# Patient Record
Sex: Female | Born: 1985 | Race: White | Hispanic: No | Marital: Married | Smoking: Never smoker
Health system: Southern US, Community
[De-identification: ages and names within clinical notes are randomized; demographics above are authoritative.]

## PROBLEM LIST (undated history)

## (undated) DIAGNOSIS — R87810 Cervical high risk human papillomavirus (HPV) DNA test positive: Secondary | ICD-10-CM

## (undated) DIAGNOSIS — R85612 Low grade squamous intraepithelial lesion on cytologic smear of anus (LGSIL): Secondary | ICD-10-CM

## (undated) DIAGNOSIS — A630 Anogenital (venereal) warts: Secondary | ICD-10-CM

## (undated) DIAGNOSIS — Z803 Family history of malignant neoplasm of breast: Secondary | ICD-10-CM

## (undated) DIAGNOSIS — O24419 Gestational diabetes mellitus in pregnancy, unspecified control: Secondary | ICD-10-CM

## (undated) DIAGNOSIS — I341 Nonrheumatic mitral (valve) prolapse: Secondary | ICD-10-CM

## (undated) DIAGNOSIS — Z6711 Type A blood, Rh negative: Secondary | ICD-10-CM

## (undated) DIAGNOSIS — R87610 Atypical squamous cells of undetermined significance on cytologic smear of cervix (ASC-US): Secondary | ICD-10-CM

## (undated) DIAGNOSIS — R17 Unspecified jaundice: Secondary | ICD-10-CM

## (undated) DIAGNOSIS — Z13220 Encounter for screening for lipoid disorders: Secondary | ICD-10-CM

## (undated) DIAGNOSIS — G43709 Chronic migraine without aura, not intractable, without status migrainosus: Secondary | ICD-10-CM

## (undated) DIAGNOSIS — D696 Thrombocytopenia, unspecified: Secondary | ICD-10-CM

## (undated) DIAGNOSIS — R040 Epistaxis: Secondary | ICD-10-CM

## (undated) HISTORY — PX: CYST EXCISION: SHX5701

## (undated) HISTORY — DX: Cervical high risk human papillomavirus (HPV) DNA test positive: R87.810

## (undated) HISTORY — DX: Low grade squamous intraepithelial lesion on cytologic smear of anus (LGSIL): R85.612

## (undated) HISTORY — DX: Type A blood, Rh negative: Z67.11

## (undated) HISTORY — PX: WISDOM TOOTH EXTRACTION: SHX21

## (undated) HISTORY — DX: Family history of malignant neoplasm of breast: Z80.3

## (undated) HISTORY — DX: Anogenital (venereal) warts: A63.0

## (undated) HISTORY — DX: Atypical squamous cells of undetermined significance on cytologic smear of cervix (ASC-US): R87.610

## (undated) HISTORY — DX: Nonrheumatic mitral (valve) prolapse: I34.1

---

## 2008-03-12 HISTORY — PX: COLPOSCOPY: SHX161

## 2008-10-05 DIAGNOSIS — R87610 Atypical squamous cells of undetermined significance on cytologic smear of cervix (ASC-US): Secondary | ICD-10-CM

## 2008-10-05 HISTORY — DX: Atypical squamous cells of undetermined significance on cytologic smear of cervix (ASC-US): R87.610

## 2009-11-08 ENCOUNTER — Ambulatory Visit: Payer: Self-pay | Admitting: Internal Medicine

## 2009-11-18 ENCOUNTER — Ambulatory Visit: Payer: Self-pay | Admitting: Internal Medicine

## 2009-11-19 ENCOUNTER — Ambulatory Visit: Payer: Self-pay | Admitting: Internal Medicine

## 2009-12-12 ENCOUNTER — Ambulatory Visit: Payer: Self-pay | Admitting: Internal Medicine

## 2010-05-22 ENCOUNTER — Ambulatory Visit: Payer: Self-pay | Admitting: Internal Medicine

## 2010-06-13 ENCOUNTER — Ambulatory Visit: Payer: Self-pay | Admitting: Internal Medicine

## 2011-05-06 DIAGNOSIS — R85612 Low grade squamous intraepithelial lesion on cytologic smear of anus (LGSIL): Secondary | ICD-10-CM

## 2011-05-06 HISTORY — DX: Low grade squamous intraepithelial lesion on cytologic smear of anus (LGSIL): R85.612

## 2011-05-08 DIAGNOSIS — R87810 Cervical high risk human papillomavirus (HPV) DNA test positive: Secondary | ICD-10-CM

## 2011-05-08 HISTORY — DX: Cervical high risk human papillomavirus (HPV) DNA test positive: R87.810

## 2011-06-02 ENCOUNTER — Ambulatory Visit: Payer: Self-pay | Admitting: Oncology

## 2011-06-02 LAB — CBC CANCER CENTER
Basophil #: 0 x10 3/mm (ref 0.0–0.1)
Basophil %: 0.7 %
Eosinophil #: 0.1 x10 3/mm (ref 0.0–0.7)
Eosinophil %: 2.3 %
HCT: 41.8 % (ref 35.0–47.0)
HGB: 13.9 g/dL (ref 12.0–16.0)
Lymphocyte #: 1.9 x10 3/mm (ref 1.0–3.6)
Lymphocyte %: 34.4 %
MCHC: 33.3 g/dL (ref 32.0–36.0)
MCV: 91 fL (ref 80–100)
Monocyte #: 0.3 x10 3/mm (ref 0.2–0.9)
Neutrophil #: 3.2 x10 3/mm (ref 1.4–6.5)
Neutrophil %: 56.8 %
RBC: 4.6 10*6/uL (ref 3.80–5.20)

## 2011-06-13 ENCOUNTER — Ambulatory Visit: Payer: Self-pay | Admitting: Oncology

## 2012-04-14 ENCOUNTER — Ambulatory Visit: Payer: Self-pay

## 2012-06-30 ENCOUNTER — Ambulatory Visit: Payer: Self-pay | Admitting: Family Medicine

## 2013-11-27 IMAGING — US US SOFT TISSUE EXCLUDE HEAD/NECK
1 series · 14 of 24 positions shown · non-contrast
Comparison: none

REASON FOR EXAM: rt axilla lump
COMMENTS:

[Series 1: us soft tissue exclude head/neck · 0.08mm/px · 14 of 24 slices shown]
[im 1/24]
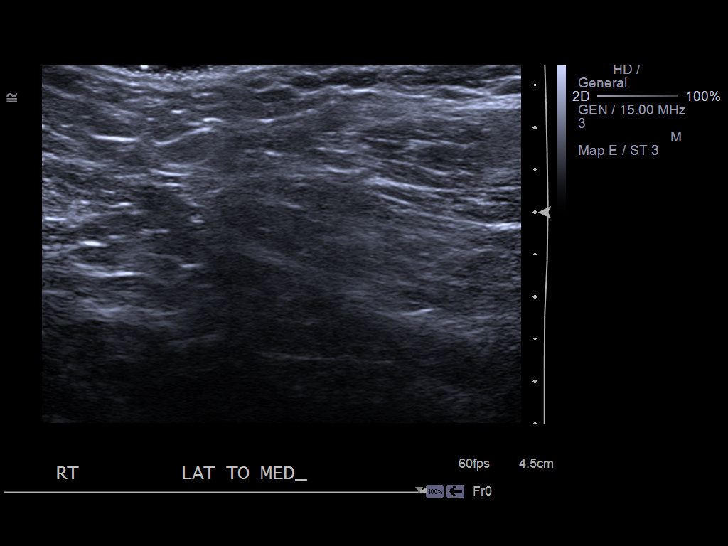
[im 3/24]
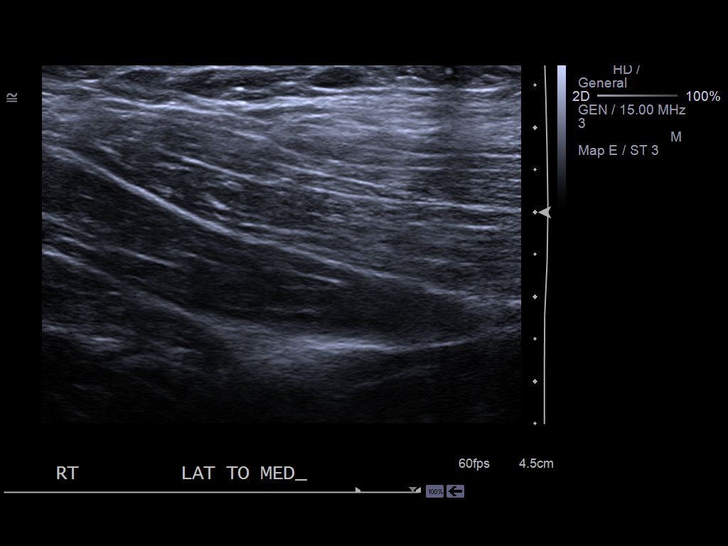
[im 5/24]
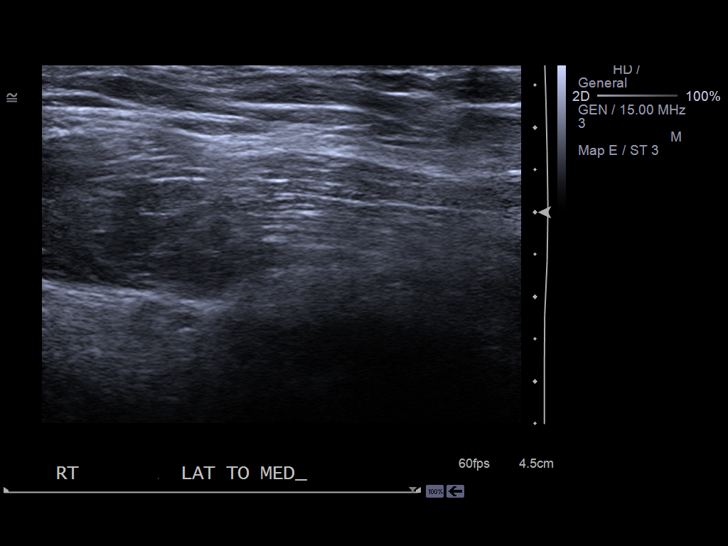
[im 7/24]
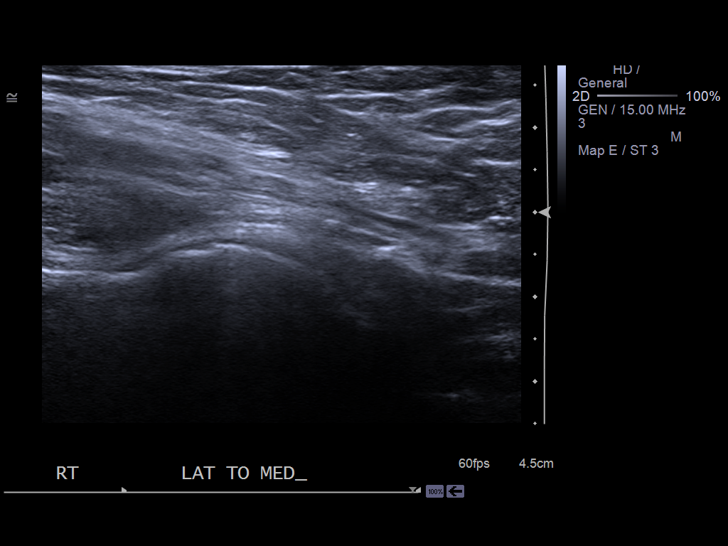
[im 8/24]
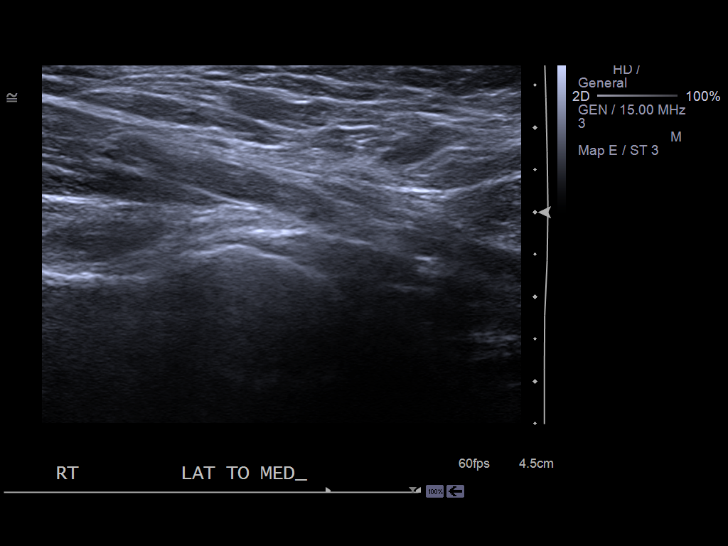
[im 10/24]
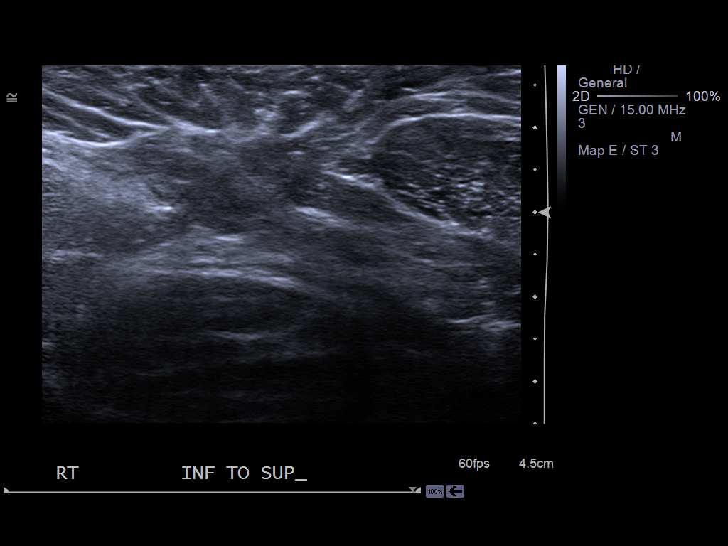
[im 12/24]
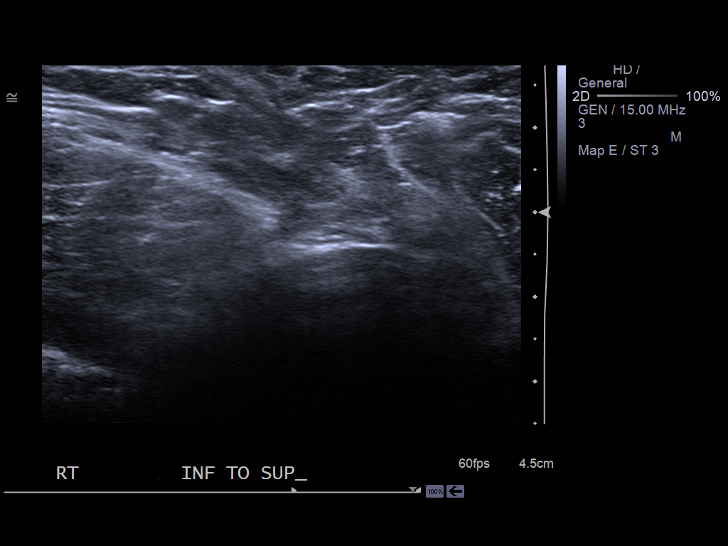
[im 13/24]
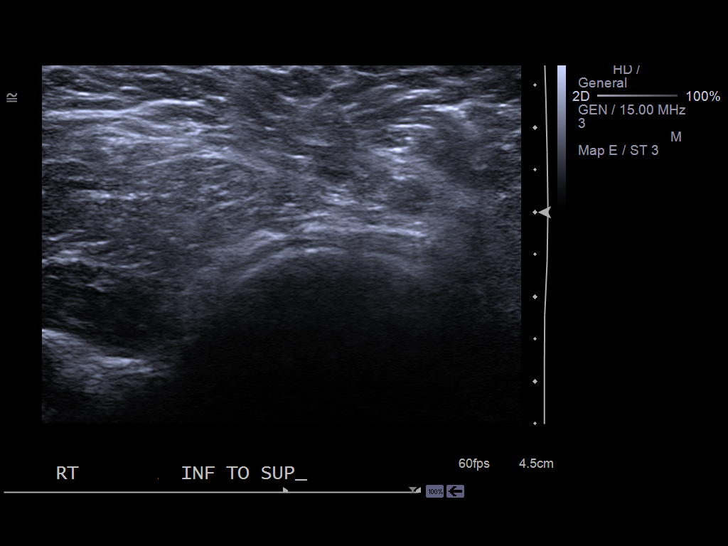
[im 15/24]
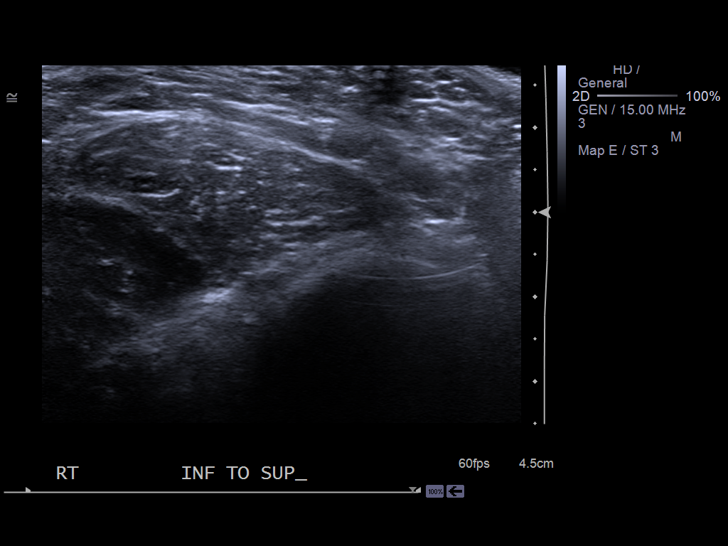
[im 17/24]
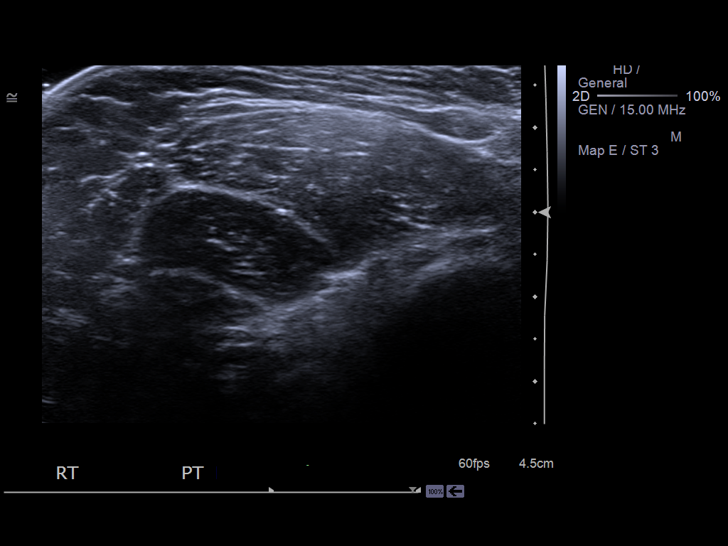
[im 19/24]
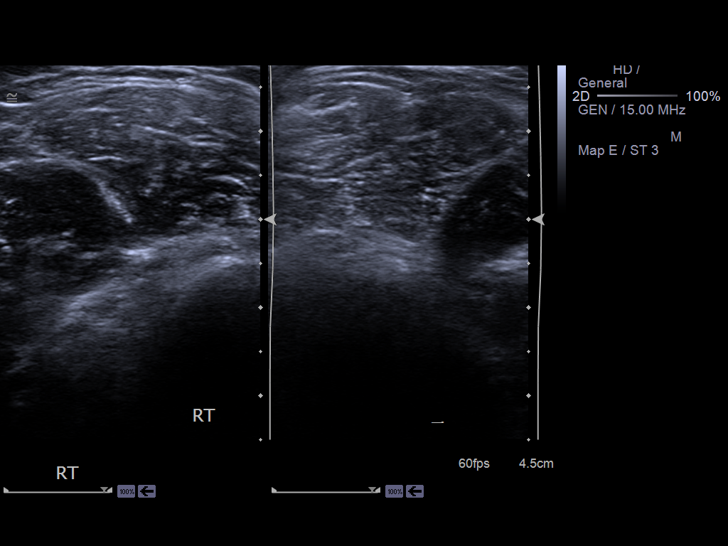
[im 20/24]
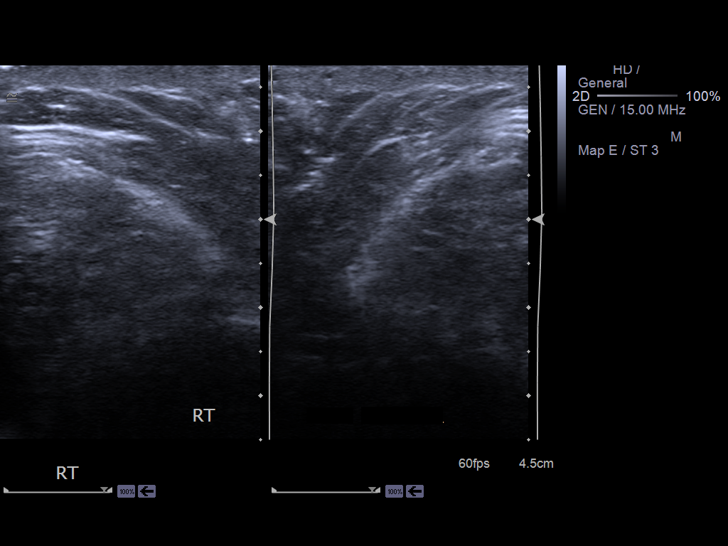
[im 22/24]
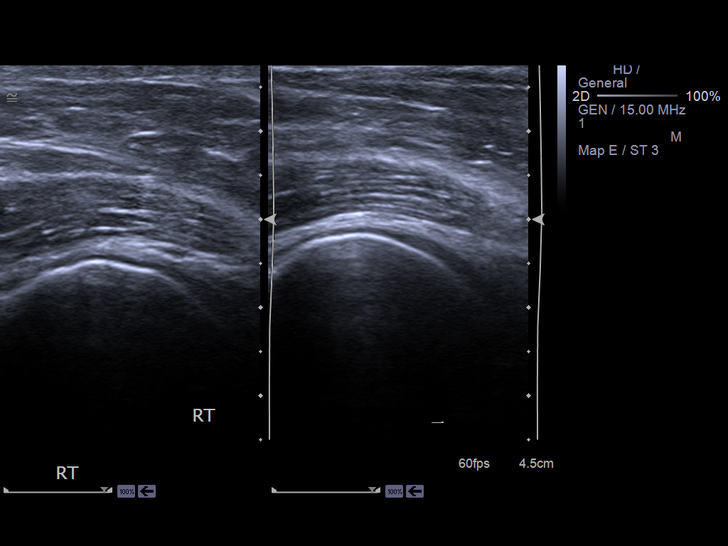
[im 24/24]
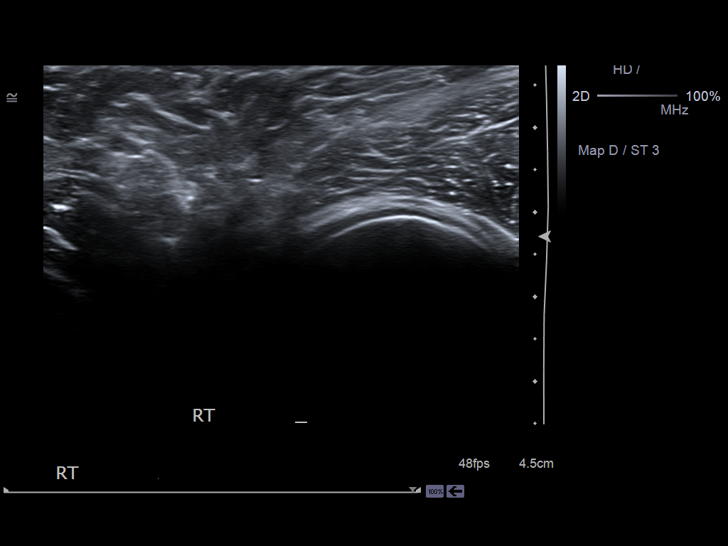

[14 of 24 positions shown; findings below may reference images not displayed]

PROCEDURE:     REEDY - REEDY SOFT TISSUE NOT HEAD/NECK  - April 14, 2012 [DATE]

RESULT:     The patient reports an area of nodularity in the right axilla.
Grayscale and Doppler techniques were employed to evaluate the right axilla.

The echotexture of the imaged parenchyma is normal. There is no cystic or
solid mass. No abnormal vascularity is demonstrated. Comparison views of the
left axillary region reveals no suspicious findings.
IMPRESSION: There is no demonstrable abnormality in the right axilla at
ultrasound. If the patient's symptoms persist, CT scanning of the chest with
attention to the abdomen may be useful.

[REDACTED]

## 2013-12-03 ENCOUNTER — Inpatient Hospital Stay: Payer: Self-pay | Admitting: Obstetrics and Gynecology

## 2013-12-04 LAB — CBC WITH DIFFERENTIAL/PLATELET
BASOS PCT: 0.3 %
Basophil #: 0 10*3/uL (ref 0.0–0.1)
Eosinophil #: 0 10*3/uL (ref 0.0–0.7)
Eosinophil %: 0 %
HCT: 42 % (ref 35.0–47.0)
HGB: 14 g/dL (ref 12.0–16.0)
LYMPHS ABS: 1.6 10*3/uL (ref 1.0–3.6)
LYMPHS PCT: 13.7 %
MCH: 30.8 pg (ref 26.0–34.0)
MCHC: 33.4 g/dL (ref 32.0–36.0)
MCV: 92 fL (ref 80–100)
MONOS PCT: 5.2 %
Monocyte #: 0.6 x10 3/mm (ref 0.2–0.9)
NEUTROS PCT: 80.8 %
Neutrophil #: 9.6 10*3/uL — ABNORMAL HIGH (ref 1.4–6.5)
PLATELETS: 115 10*3/uL — AB (ref 150–440)
RBC: 4.55 10*6/uL (ref 3.80–5.20)
RDW: 13.6 % (ref 11.5–14.5)
WBC: 11.8 10*3/uL — ABNORMAL HIGH (ref 3.6–11.0)

## 2013-12-05 LAB — HEMATOCRIT: HCT: 35.4 % (ref 35.0–47.0)

## 2014-02-12 IMAGING — CR DG CHEST 2V
1 series · 2 of 2 positions shown · non-contrast
Comparison: none

REASON FOR EXAM: chest pain , hx of mvp
COMMENTS:

PROCEDURE:     PULLUM - PULLUM CHEST PA (OR AP) AND LAT  - June 30, 2012  [DATE]
RESULT:     The lungs are clear. The heart and pulmonary vessels are normal.
The bony and mediastinal structures are unremarkable. There is no effusion.
There is no pneumothorax or evidence of congestive failure.

[Series 1: pa · 0.17mm/px · 2 of 2 slices shown]
[im 1/2]
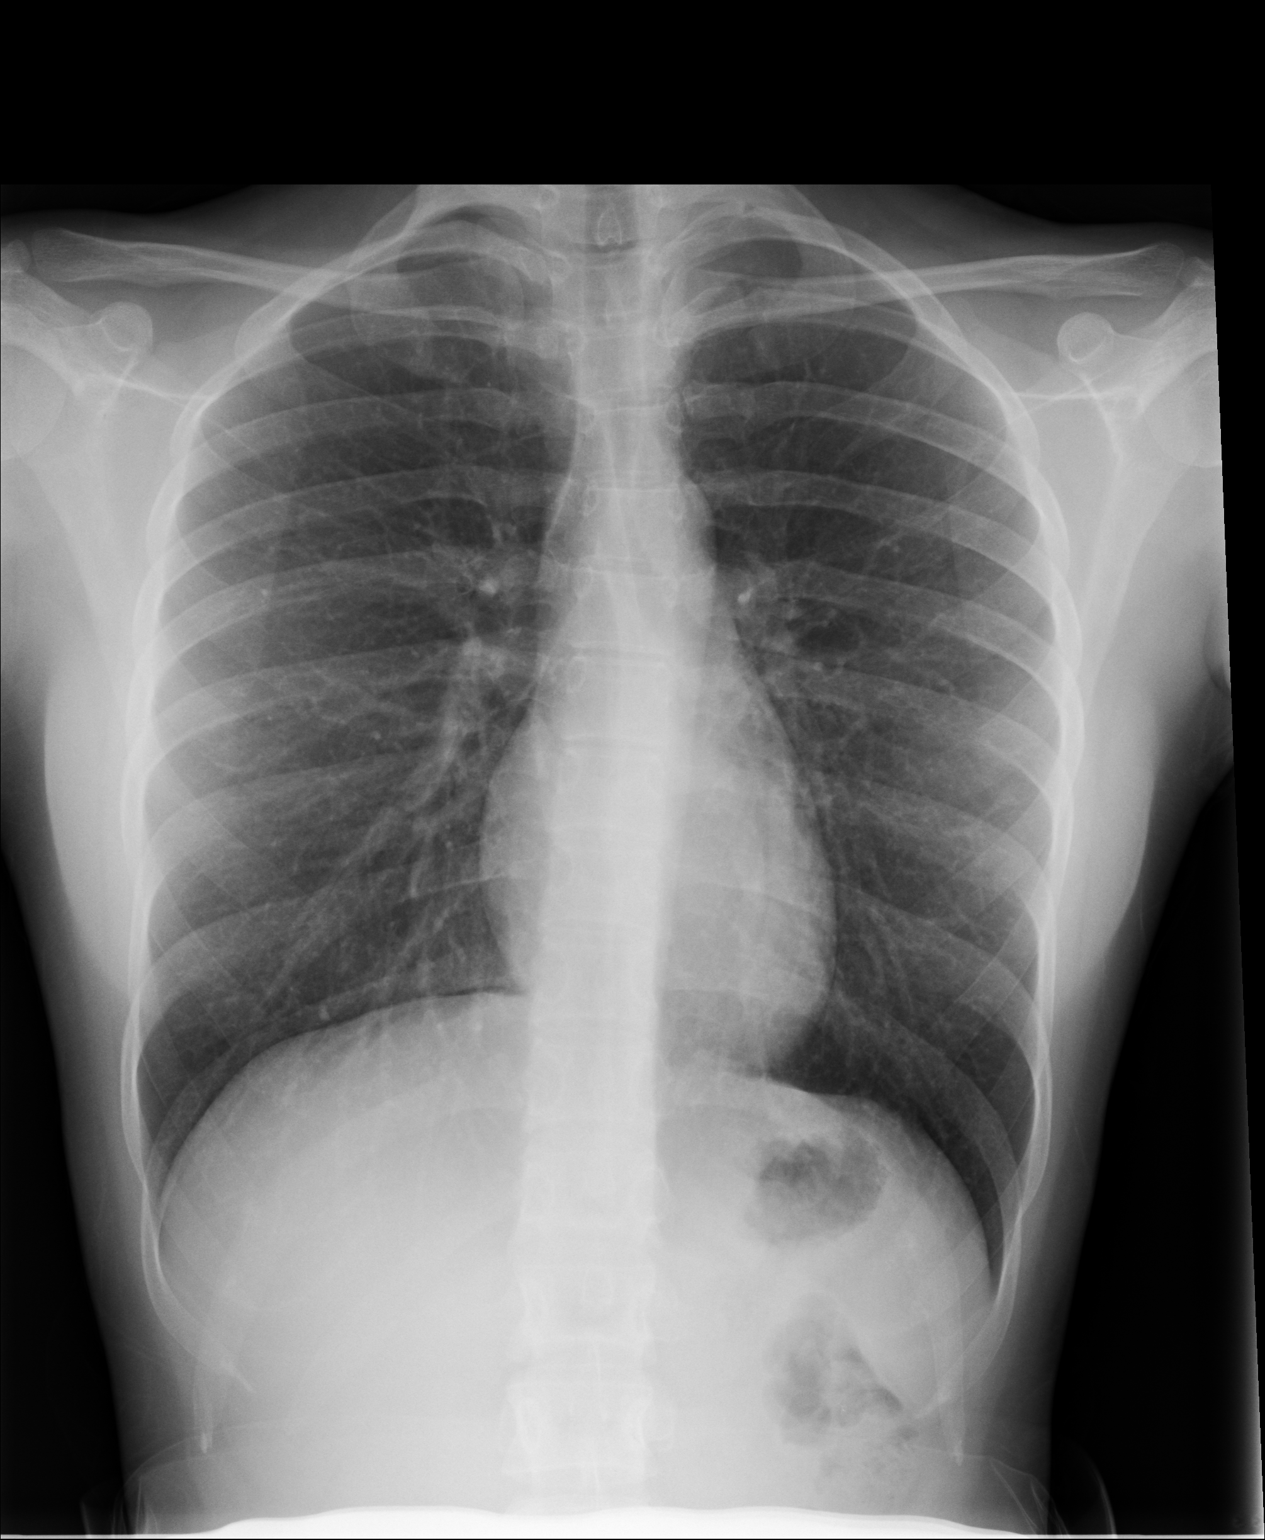
[im 2/2]
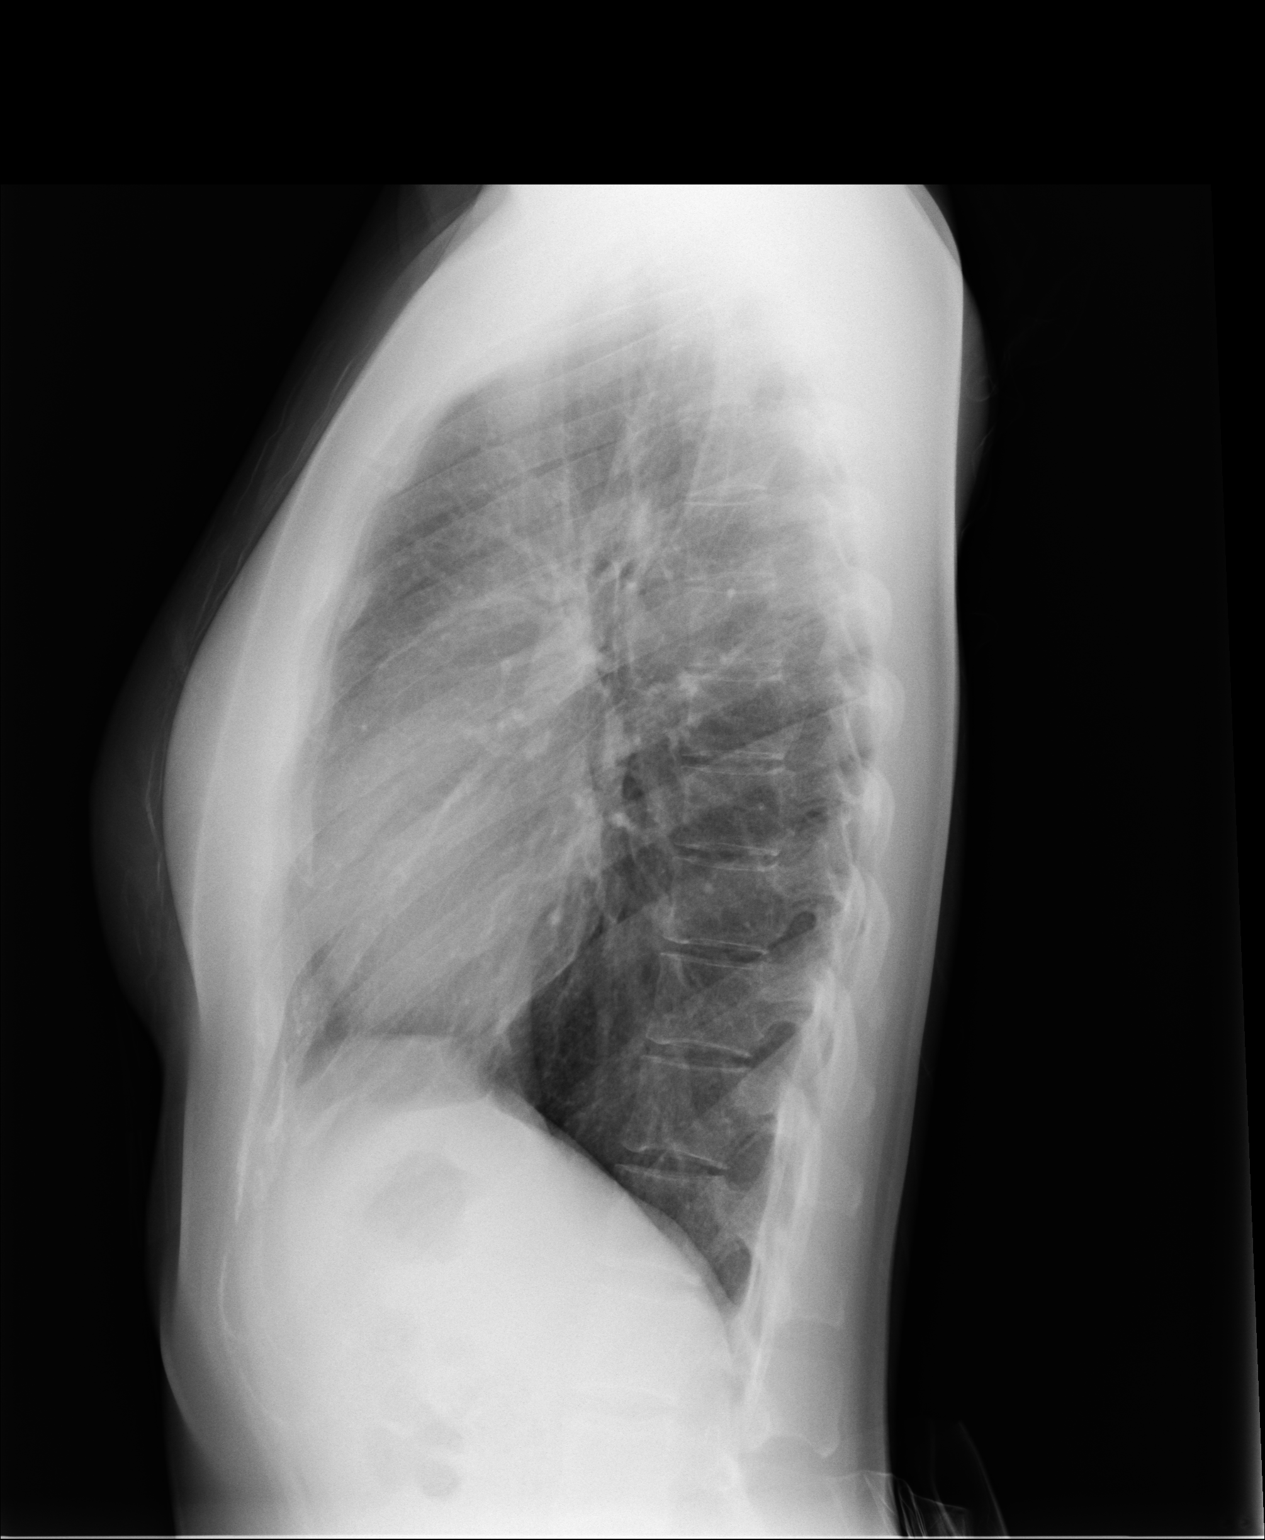

[2 of 2 positions shown; findings below may reference images not displayed]

IMPRESSION: No acute cardiopulmonary disease.

[REDACTED]

## 2014-05-22 NOTE — H&P (Signed)
L&D Evaluation:  History:  HPI 29 year old g1 P0 with EDC=11//21/2015 8wk3 day ultrasound presents  at 40 1/7 weeks with c/o contractions since 0600 this AM. There has been little change in her cervix over the last 4 hours and she remains 3 cm despite painful regular ctxs. Denies vaginal bleeding or LOF. Baby active. PNC at [facility] remarkable for mild thrombocytopenia (134 on 4/1) and being RH negative. She received Rhogam 9/4 and her TDAP 9/18. She failed her 1 hr GTT and passed her 3 hr GTT.  LABS: A neg, VI, RI, GBS negative. Desires to bottle feed.   Presents with contractions   Patient's Medical History No Chronic Illness  H/O abnormal pap smears   Patient's Surgical History Wisdom teeth extraction and colposcopy   Medications Pre Natal Vitamins   Allergies NKDA   Social History none   Family History Non-Contributory   ROS:  ROS see HPI   Exam:  Vital Signs stable   Urine Protein not completed   General breathing with contractions   Mental Status clear   Chest clear   Heart normal sinus rhythm, no murmur/gallop/rubs   Abdomen gravid, tender with contractions   Estimated Fetal Weight 8#   Fetal Position cephalic   Edema no edema   Pelvic no external lesions, 3/80-90%/-1   Mebranes Intact   FHT normal rate with no decels, 130s with accels to 160s   Ucx q4 min   Skin dry   Impression:  Impression IUP at 40 1/7 weeks with early vs prodromal labor   Plan:  Plan EFM/NST, monitor contractions and for cervical change, Morphine 8 mgm and Phenergan 12.5 mgm IM for therapeutic rest. Has eaten and been walking and on the birthing ball.   Electronic Signatures: Karene Fry (CNM)  (Signed [phone] 22:07)  Authored: L&D Evaluation   Last Updated: 22-Nov-15 22:07 by Karene Fry (CNM)

## 2014-09-10 ENCOUNTER — Telehealth: Payer: Self-pay | Admitting: Family Medicine

## 2014-09-10 ENCOUNTER — Encounter: Payer: Self-pay | Admitting: Family Medicine

## 2014-09-10 ENCOUNTER — Ambulatory Visit (INDEPENDENT_AMBULATORY_CARE_PROVIDER_SITE_OTHER): Payer: BC Managed Care – PPO | Admitting: Family Medicine

## 2014-09-10 VITALS — BP 122/87 | HR 102 | Temp 98.7°F | Ht 61.0 in | Wt 102.0 lb

## 2014-09-10 DIAGNOSIS — J029 Acute pharyngitis, unspecified: Secondary | ICD-10-CM

## 2014-09-10 DIAGNOSIS — J020 Streptococcal pharyngitis: Secondary | ICD-10-CM | POA: Diagnosis not present

## 2014-09-10 LAB — MONONUCLEOSIS SCREEN: MONO SCREEN: NEGATIVE

## 2014-09-10 MED ORDER — AMOXICILLIN-POT CLAVULANATE 875-125 MG PO TABS: 1.0000 | ORAL_TABLET | Freq: Two times a day (BID) | ORAL | Status: DC

## 2014-09-10 NOTE — Addendum Note (Signed)
Addended by: Valerie Roys on: 09/10/2014 02:43 PM   Modules accepted: Orders, SmartSet

## 2014-09-10 NOTE — Patient Instructions (Signed)
Infectious Mononucleosis Infectious mononucleosis (mono) is a common germ (viral) infection in children, teenagers, and young adults.  CAUSES  Mono is an infection caused by the Randell Patient virus. The virus is spread by close personal contact with someone who has the infection. It can be passed by contact with your saliva through things such as kissing or sharing drinking glasses. Sometimes, the infection can be spread from someone who does not appear sick but still spreads the virus (asymptomatic carrier state).  SYMPTOMS  The most common symptoms of Mono are:  Sore throat.  Headache.  Fatigue.  Muscle aches.  Swollen glands.  Fever.  Poor appetite.  Enlarged liver or spleen. The less common symptoms can include:  Rash.  Feeling sick to your stomach (nauseous).  Abdominal pain. DIAGNOSIS  Mono is diagnosed by a blood test.  TREATMENT  Treatment of mono is usually at home. There is no medicine that cures this virus. Sometimes hospital treatment is needed in severe cases. Steroid medicine sometimes is needed if the swelling in the throat causes breathing or swallowing problems.  HOME CARE INSTRUCTIONS   Drink enough fluids to keep your urine clear or pale yellow.  Eat soft foods. Cool foods like popsicles or ice cream can soothe a sore throat.  Only take over-the-counter or prescription medicines for pain, discomfort, or fever as directed by your caregiver. Children under 37 years of age should not take aspirin.  Gargle salt water. This may help relieve your sore throat. Put 1 teaspoon (tsp) of salt in 1 cup of warm water. Sucking on hard candy may also help.  Rest as needed.  Start regular activities gradually after the fever is gone. Be sure to rest when tired.  Avoid strenuous exercise or contact sports until your caregiver says it is okay. The liver and spleen could be seriously injured.  Avoid sharing drinking glasses or kissing until your caregiver tells you  that you are no longer contagious. SEEK MEDICAL CARE IF:   Your fever is not gone after 7 days.  Your activity level is not back to normal after 2 weeks.  You have yellow coloring to eyes and skin (jaundice). SEEK IMMEDIATE MEDICAL CARE IF:   You have severe pain in the abdomen or shoulder.  You have trouble swallowing or drooling.  You have trouble breathing.  You develop a stiff neck.  You develop a severe headache.  You cannot stop throwing up (vomiting).  You have convulsions.  You are confused.  You have trouble with balance.  You develop signs of body fluid loss (dehydration):  Weakness.  Sunken eyes.  Pale skin.  Dry mouth.  Rapid breathing or pulse. MAKE SURE YOU:   Understand these instructions.  Will watch your condition.  Will get help right away if you are not doing well or get worse. Document Released: 12/27/1999 Document Revised: 03/23/2011 Document Reviewed: 10/25/2007 [facility] Patient Information 2015 [place], [place]. This information is not intended to replace advice given to you by your health care provider. Make sure you discuss any questions you have with your health care provider. Strep Throat Strep throat is an infection of the throat. It is caused by a germ. Strep throat spreads from person to person by coughing, sneezing, or close contact. HOME CARE  Rinse your mouth (gargle) with warm salt water (1 teaspoon salt in 1 cup of water). Do this 3 to 4 times per day or as needed for comfort.  Family members with a sore throat or fever  should see a doctor.  Make sure everyone in your house washes their hands well.  Do not share food, drinking cups, or personal items.  Eat soft foods until your sore throat gets better.  Drink enough water and fluids to keep your pee (urine) clear or pale yellow.  Rest.  Stay home from school, daycare, or work until you have taken medicine for 24 hours.  Only take medicine as told by your  doctor.  Take your medicine as told. Finish it even if you start to feel better. GET HELP RIGHT AWAY IF:   You have new problems, such as throwing up (vomiting) or bad headaches.  You have a stiff or painful neck, chest pain, trouble breathing, or trouble swallowing.  You have very bad throat pain, drooling, or changes in your voice.  Your neck puffs up (swells) or gets red and tender.  You have a fever.  You are very tired, your mouth is dry, or you are peeing less than normal.  You cannot wake up completely.  You get a rash, cough, or earache.  You have green, yellow-brown, or bloody spit.  Your pain does not get better with medicine. MAKE SURE YOU:   Understand these instructions.  Will watch your condition.  Will get help right away if you are not doing well or get worse. Document Released: 06/17/2007 Document Revised: 03/23/2011 Document Reviewed: 02/27/2010 [facility] Patient Information 2015 [place], [place]. This information is not intended to replace advice given to you by your health care provider. Make sure you discuss any questions you have with your health care provider.

## 2014-09-10 NOTE — Telephone Encounter (Signed)
Called patient to let her know mono was negative. Will treat for strep. Rx sent to her pharmacy.

## 2014-09-10 NOTE — Progress Notes (Signed)
BP 122/87 mmHg  Pulse 102  Temp(Src) 98.7 F (37.1 C)  Ht 5\' 1"  (1.549 m)  Wt 102 lb (46.267 kg)  BMI 19.28 kg/m2  SpO2 100%  LMP 09/03/2014   Subjective:    Patient ID: Monica Shaffer, female    DOB: 11-28-1985, 29 y.o.   MRN: [id number]  HPI: Monica Shaffer is a 29 y.o. female  Chief Complaint  Patient presents with  . Sore Throat    X 3 days, fatigued, some body aches, headache, fever unknown   Worst symptom: Sore throat Fever: no Cough: no Shortness of breath: no Wheezing: no Chest pain: yes, with cough Chest tightness: no Chest congestion: no Nasal congestion: no Runny nose: no Post nasal drip: no Sneezing: no Sore throat: yes Swollen glands: no Sinus pressure: no Headache: yes Face pain: no Toothache: no Ear pain: no  Ear pressure: no  Eyes red/itching:no Eye drainage/crusting: no  Vomiting: no Rash: no Fatigue: yes Sick contacts: no Strep contacts: no  Context: better Recurrent sinusitis: no Relief with OTC cold/cough medications: no  Treatments attempted: none   Relevant past medical, surgical, family and social history reviewed and updated as indicated. Interim medical history since our last visit reviewed. Allergies and medications reviewed and updated.  Review of Systems  Constitutional: Negative.   Respiratory: Negative.     Per HPI unless specifically indicated above     Objective:    BP 122/87 mmHg  Pulse 102  Temp(Src) 98.7 F (37.1 C)  Ht 5\' 1"  (1.549 m)  Wt 102 lb (46.267 kg)  BMI 19.28 kg/m2  SpO2 100%  LMP 09/03/2014  Wt Readings from Last 3 Encounters:  09/10/14 102 lb (46.267 kg)    Physical Exam  Constitutional: She is oriented to person, place, and time. She appears well-developed and well-nourished. No distress.  HENT:  Head: Normocephalic and atraumatic.  Right Ear: Hearing, tympanic membrane, external ear and ear canal normal.  Left Ear: Hearing, tympanic membrane, external ear and ear canal normal.  Nose:  Nose normal.  Mouth/Throat: Uvula is midline and mucous membranes are normal. Oropharyngeal exudate, posterior oropharyngeal edema and posterior oropharyngeal erythema present. No tonsillar abscesses.    Eyes: Conjunctivae and lids are normal. Right eye exhibits no discharge. Left eye exhibits no discharge. No scleral icterus.  Cardiovascular: Normal rate, regular rhythm, normal heart sounds and intact distal pulses.  Exam reveals no gallop and no friction rub.   No murmur heard. Pulmonary/Chest: Effort normal and breath sounds normal. No respiratory distress. She has no wheezes. She has no rales. She exhibits no tenderness.  Musculoskeletal: Normal range of motion.  Neurological: She is alert and oriented to person, place, and time.  Skin: Skin is warm, dry and intact. No rash noted. No erythema. No pallor.  Psychiatric: She has a normal mood and affect. Her speech is normal and behavior is normal. Judgment and thought content normal. Cognition and memory are normal.  Nursing note and vitals reviewed.   Results for orders placed or performed in visit on 12/03/13  CBC with Differential/Platelet  Result Value Ref Range   WBC 11.8 (H) 3.6-11.0 x10 3/mm 3   RBC 4.55 3.80-5.20 X10 6/mm 3   HGB 14.0 12.0-16.0 g/dL   HCT 42.0 35.0-47.0 %   MCV 92 80-100 fL   MCH 30.8 26.0-34.0 pg   MCHC 33.4 32.0-36.0 g/dL   RDW 13.6 11.5-14.5 %   Platelet 115 (L) 150-440 x10 3/mm 3   Neutrophil % 80.8 %  Lymphocyte % 13.7 %   Monocyte % 5.2 %   Eosinophil % 0.0 %   Basophil % 0.3 %   Neutrophil # 9.6 (H) 1.4-6.5 x10 3/mm 3   Lymphocyte # 1.6 1.0-3.6 x10 3/mm 3   Monocyte # 0.6 0.2-0.9 x10 3/mm    Eosinophil # 0.0 0.0-0.7 x10 3/mm 3   Basophil # 0.0 0.0-0.1 x10 3/mm 3  Hematocrit  Result Value Ref Range   HCT 35.4 35.0-47.0 %      Assessment & Plan:   Problem List Items Addressed This Visit    None    Visit Diagnoses    Sore throat    -  Primary    Rapid strep negative. Awaiting monospot.  If monospot + symptomatic treatment. If monospot neg will treat for strep emprically while waiting on culture.     Relevant Orders    Strep Gp A Ag, IA W/Reflex    Mononucleosis screen (STAT)        Follow up plan: No Follow-up on file.

## 2014-09-12 LAB — STREP GP A AG, IA W/REFLEX

## 2015-06-12 ENCOUNTER — Ambulatory Visit (INDEPENDENT_AMBULATORY_CARE_PROVIDER_SITE_OTHER): Payer: BC Managed Care – PPO | Admitting: Unknown Physician Specialty

## 2015-06-12 ENCOUNTER — Encounter: Payer: Self-pay | Admitting: Unknown Physician Specialty

## 2015-06-12 VITALS — BP 119/76 | HR 82 | Temp 97.2°F | Ht 60.5 in | Wt 104.6 lb

## 2015-06-12 DIAGNOSIS — L409 Psoriasis, unspecified: Secondary | ICD-10-CM | POA: Diagnosis not present

## 2015-06-12 DIAGNOSIS — D2362 Other benign neoplasm of skin of left upper limb, including shoulder: Secondary | ICD-10-CM

## 2015-06-12 DIAGNOSIS — D367 Benign neoplasm of other specified sites: Secondary | ICD-10-CM

## 2015-06-12 MED ORDER — TRIAMCINOLONE ACETONIDE 0.1 % EX CREA: 1.0000 "application " | TOPICAL_CREAM | Freq: Two times a day (BID) | CUTANEOUS | Status: DC

## 2015-06-12 NOTE — Progress Notes (Signed)
   BP 119/76 mmHg  Pulse 82  Temp(Src) 97.2 F (36.2 C)  Ht 5' 0.5" (1.537 m)  Wt 104 lb 9.6 oz (47.446 kg)  BMI 20.08 kg/m2  SpO2 100%  LMP 05/23/2015 (Approximate)   Subjective:    Patient ID: Monica Shaffer, female    DOB: [birth year], 30 y.o.   MRN: [id number]  HPI: Monica Shaffer is a 30 y.o. female  Chief Complaint  Patient presents with  . Mass    pt states she has a lump on her left arm that came up a few months ago. States it is not painful.    Pt states she has a lump on arm that she states has gotten bigger and darker.  It is painless.    Patch of skin left foot on flexor surface Relevant past medical, surgical, family and social history reviewed and updated as indicated. Interim medical history since our last visit reviewed. Allergies and medications reviewed and updated.  Review of Systems  Per HPI unless specifically indicated above     Objective:    BP 119/76 mmHg  Pulse 82  Temp(Src) 97.2 F (36.2 C)  Ht 5' 0.5" (1.537 m)  Wt 104 lb 9.6 oz (47.446 kg)  BMI 20.08 kg/m2  SpO2 100%  LMP 05/23/2015 (Approximate)  Wt Readings from Last 3 Encounters:  06/12/15 104 lb 9.6 oz (47.446 kg)  09/10/14 102 lb (46.267 kg)    Physical Exam  Constitutional: She is oriented to person, place, and time. She appears well-developed and well-nourished. No distress.  HENT:  Head: Normocephalic and atraumatic.  Eyes: Conjunctivae and lids are normal. Right eye exhibits no discharge. Left eye exhibits no discharge. No scleral icterus.  Cardiovascular: Normal rate.   Pulmonary/Chest: Effort normal.  Abdominal: Normal appearance. There is no splenomegaly or hepatomegaly.  Musculoskeletal: Normal range of motion.  Neurological: She is alert and oriented to person, place, and time.  Skin: Skin is intact. No rash noted. No pallor.  Painless lump 2 cms on left arm.  I&D unsuccessful.  Refer to Dermatology  Patch of dry skin left ankle  Psychiatric: She has a normal mood and  affect. Her behavior is normal. Judgment and thought content normal.    Results for orders placed or performed in visit on 09/10/14  Strep Gp A Ag, IA W/Reflex  Result Value Ref Range   Strep A Culture Comment (A)   Mononucleosis screen (STAT)  Result Value Ref Range   Mono Screen Negative Negative      Assessment & Plan:   Problem List Items Addressed This Visit      Unprioritized   Psoriasis - Primary   Relevant Medications   triamcinolone cream (KENALOG) 0.1 %    Other Visit Diagnoses    Cyst, dermoid, arm, left        Relevant Orders    Ambulatory referral to Dermatology        Follow up plan: Return for with dermatology.

## 2016-02-03 LAB — OB RESULTS CONSOLE ABO/RH: RH Type: NEGATIVE

## 2016-02-03 LAB — OB RESULTS CONSOLE RPR: RPR: NONREACTIVE

## 2016-02-03 LAB — HM PAP SMEAR

## 2016-02-03 LAB — OB RESULTS CONSOLE GC/CHLAMYDIA
CHLAMYDIA, DNA PROBE: NEGATIVE
GC PROBE AMP, GENITAL: NEGATIVE

## 2016-02-03 LAB — OB RESULTS CONSOLE HIV ANTIBODY (ROUTINE TESTING): HIV: NONREACTIVE

## 2016-02-03 LAB — OB RESULTS CONSOLE PLATELET COUNT: Platelets: 162 10*3/uL

## 2016-02-03 LAB — OB RESULTS CONSOLE ANTIBODY SCREEN: ANTIBODY SCREEN: NEGATIVE

## 2016-02-03 LAB — OB RESULTS CONSOLE HEPATITIS B SURFACE ANTIGEN: HEP B S AG: NEGATIVE

## 2016-02-03 LAB — OB RESULTS CONSOLE RUBELLA ANTIBODY, IGM: Rubella: IMMUNE

## 2016-02-03 LAB — OB RESULTS CONSOLE VARICELLA ZOSTER ANTIBODY, IGG: Varicella: IMMUNE

## 2016-02-03 LAB — OB RESULTS CONSOLE HGB/HCT, BLOOD
HEMATOCRIT: 42 %
HEMOGLOBIN: 14.2 g/dL

## 2016-04-02 ENCOUNTER — Ambulatory Visit (INDEPENDENT_AMBULATORY_CARE_PROVIDER_SITE_OTHER): Payer: BC Managed Care – PPO | Admitting: Advanced Practice Midwife

## 2016-04-02 ENCOUNTER — Encounter: Payer: Self-pay | Admitting: Advanced Practice Midwife

## 2016-04-02 VITALS — BP 126/66 | Wt 115.0 lb

## 2016-04-02 DIAGNOSIS — Z3A15 15 weeks gestation of pregnancy: Secondary | ICD-10-CM

## 2016-04-02 DIAGNOSIS — Z3400 Encounter for supervision of normal first pregnancy, unspecified trimester: Secondary | ICD-10-CM

## 2016-04-02 DIAGNOSIS — Z369 Encounter for antenatal screening, unspecified: Secondary | ICD-10-CM

## 2016-04-02 NOTE — Progress Notes (Signed)
Doing well. Feels like she is getting bigger faster with this pregnancy. Heart tones heard in appropriate location for 16 week pregnancy.

## 2016-04-07 DIAGNOSIS — Z803 Family history of malignant neoplasm of breast: Secondary | ICD-10-CM

## 2016-04-07 DIAGNOSIS — Z6711 Type A blood, Rh negative: Secondary | ICD-10-CM | POA: Insufficient documentation

## 2016-05-01 ENCOUNTER — Encounter: Payer: Self-pay | Admitting: Certified Nurse Midwife

## 2016-05-01 ENCOUNTER — Ambulatory Visit (INDEPENDENT_AMBULATORY_CARE_PROVIDER_SITE_OTHER): Payer: BC Managed Care – PPO | Admitting: Certified Nurse Midwife

## 2016-05-01 ENCOUNTER — Ambulatory Visit (INDEPENDENT_AMBULATORY_CARE_PROVIDER_SITE_OTHER): Payer: BC Managed Care – PPO

## 2016-05-01 VITALS — BP 108/68 | HR 101 | Wt 119.0 lb

## 2016-05-01 DIAGNOSIS — Z3A19 19 weeks gestation of pregnancy: Secondary | ICD-10-CM

## 2016-05-01 DIAGNOSIS — Z3400 Encounter for supervision of normal first pregnancy, unspecified trimester: Secondary | ICD-10-CM

## 2016-05-01 DIAGNOSIS — O09299 Supervision of pregnancy with other poor reproductive or obstetric history, unspecified trimester: Secondary | ICD-10-CM

## 2016-05-01 DIAGNOSIS — Z3402 Encounter for supervision of normal first pregnancy, second trimester: Secondary | ICD-10-CM

## 2016-05-01 DIAGNOSIS — Z369 Encounter for antenatal screening, unspecified: Secondary | ICD-10-CM

## 2016-05-01 DIAGNOSIS — Z3490 Encounter for supervision of normal pregnancy, unspecified, unspecified trimester: Secondary | ICD-10-CM | POA: Insufficient documentation

## 2016-05-01 DIAGNOSIS — O4440 Low lying placenta NOS or without hemorrhage, unspecified trimester: Secondary | ICD-10-CM | POA: Insufficient documentation

## 2016-05-01 NOTE — Progress Notes (Signed)
Pt c/o frequent shortness of breath and tingling in right thigh at night. Anatomy scan today. It's a GIRL!

## 2016-05-01 NOTE — Progress Notes (Signed)
CGA on anatomy scan today 19wk4d, with normal but incomplete anatomy. Need N/L and 4 chamber heart views. Placenta posterior and slightly low lying : 2.08 cm from cervix. FU anatomy scan in 4 weeks Discussed some discomforts of pregnancy: has been having some right thigh paresthesia (? Compression of right lateral cutaneous nerve), episodes of SOB lasting a minute or two, and nasal congestion and bloody nose with blowing nose in AM. DIscussed possible etiology behind these complaints. Discussed what to do for persistent or prolonged SOB. Also discussed using nasal saline or a humidifier when sleeping.

## 2016-05-29 ENCOUNTER — Ambulatory Visit (INDEPENDENT_AMBULATORY_CARE_PROVIDER_SITE_OTHER): Payer: BC Managed Care – PPO

## 2016-05-29 ENCOUNTER — Ambulatory Visit (INDEPENDENT_AMBULATORY_CARE_PROVIDER_SITE_OTHER): Payer: BC Managed Care – PPO | Admitting: Obstetrics and Gynecology

## 2016-05-29 VITALS — BP 110/64 | Wt 126.0 lb

## 2016-05-29 DIAGNOSIS — Z113 Encounter for screening for infections with a predominantly sexual mode of transmission: Secondary | ICD-10-CM

## 2016-05-29 DIAGNOSIS — Z3490 Encounter for supervision of normal pregnancy, unspecified, unspecified trimester: Secondary | ICD-10-CM

## 2016-05-29 DIAGNOSIS — O4440 Low lying placenta NOS or without hemorrhage, unspecified trimester: Secondary | ICD-10-CM | POA: Diagnosis not present

## 2016-05-29 DIAGNOSIS — Z6711 Type A blood, Rh negative: Secondary | ICD-10-CM

## 2016-05-29 DIAGNOSIS — Z3A23 23 weeks gestation of pregnancy: Secondary | ICD-10-CM

## 2016-05-29 DIAGNOSIS — O09299 Supervision of pregnancy with other poor reproductive or obstetric history, unspecified trimester: Secondary | ICD-10-CM

## 2016-05-29 DIAGNOSIS — Z3402 Encounter for supervision of normal first pregnancy, second trimester: Secondary | ICD-10-CM

## 2016-05-29 DIAGNOSIS — Z131 Encounter for screening for diabetes mellitus: Secondary | ICD-10-CM

## 2016-05-29 NOTE — Progress Notes (Signed)
No vb. No lof. Anatomy scan complete now.

## 2016-06-29 ENCOUNTER — Ambulatory Visit (INDEPENDENT_AMBULATORY_CARE_PROVIDER_SITE_OTHER): Payer: BC Managed Care – PPO | Admitting: Obstetrics and Gynecology

## 2016-06-29 ENCOUNTER — Other Ambulatory Visit: Payer: BC Managed Care – PPO

## 2016-06-29 VITALS — BP 118/72 | Wt 132.0 lb

## 2016-06-29 DIAGNOSIS — Z113 Encounter for screening for infections with a predominantly sexual mode of transmission: Secondary | ICD-10-CM

## 2016-06-29 DIAGNOSIS — O09299 Supervision of pregnancy with other poor reproductive or obstetric history, unspecified trimester: Secondary | ICD-10-CM

## 2016-06-29 DIAGNOSIS — Z6711 Type A blood, Rh negative: Secondary | ICD-10-CM

## 2016-06-29 DIAGNOSIS — O09899 Supervision of other high risk pregnancies, unspecified trimester: Secondary | ICD-10-CM

## 2016-06-29 DIAGNOSIS — Z131 Encounter for screening for diabetes mellitus: Secondary | ICD-10-CM

## 2016-06-29 DIAGNOSIS — Z3490 Encounter for supervision of normal pregnancy, unspecified, unspecified trimester: Secondary | ICD-10-CM

## 2016-06-29 DIAGNOSIS — Z3A28 28 weeks gestation of pregnancy: Secondary | ICD-10-CM

## 2016-06-29 DIAGNOSIS — O4440 Low lying placenta NOS or without hemorrhage, unspecified trimester: Secondary | ICD-10-CM

## 2016-06-29 DIAGNOSIS — O26899 Other specified pregnancy related conditions, unspecified trimester: Secondary | ICD-10-CM

## 2016-06-29 DIAGNOSIS — Z6791 Unspecified blood type, Rh negative: Secondary | ICD-10-CM

## 2016-06-29 MED ORDER — RHO D IMMUNE GLOBULIN 1500 UNIT/2ML IJ SOSY
300.0000 ug | PREFILLED_SYRINGE | Freq: Once | INTRAMUSCULAR | Status: AC
  Administered 2016-06-29: 300 ug via INTRAMUSCULAR

## 2016-06-29 NOTE — Addendum Note (Signed)
Addended by: Brien Few on: 06/29/2016 09:58 AM   Modules accepted: Orders

## 2016-06-29 NOTE — Progress Notes (Signed)
28 week labs today. No vb. No lof. Rhogam today.   U/s at 34 weeks for growth Possible social indux.

## 2016-06-30 LAB — 28 WEEKS RH-PANEL
ANTIBODY SCREEN: NEGATIVE
BASOS: 0 %
Basophils Absolute: 0 10*3/uL (ref 0.0–0.2)
EOS (ABSOLUTE): 0 10*3/uL (ref 0.0–0.4)
Eos: 0 %
Gestational Diabetes Screen: 138 mg/dL (ref 65–139)
HIV Screen 4th Generation wRfx: NONREACTIVE
Hematocrit: 33.1 % — ABNORMAL LOW (ref 34.0–46.6)
Hemoglobin: 10.6 g/dL — ABNORMAL LOW (ref 11.1–15.9)
IMMATURE GRANS (ABS): 0 10*3/uL (ref 0.0–0.1)
Immature Granulocytes: 1 %
LYMPHS: 20 %
Lymphocytes Absolute: 1.4 10*3/uL (ref 0.7–3.1)
MCH: 28.4 pg (ref 26.6–33.0)
MCHC: 32 g/dL (ref 31.5–35.7)
MCV: 89 fL (ref 79–97)
MONOCYTES: 4 %
Monocytes Absolute: 0.3 10*3/uL (ref 0.1–0.9)
NEUTROS ABS: 5.2 10*3/uL (ref 1.4–7.0)
NEUTROS PCT: 75 %
Platelets: 172 10*3/uL (ref 150–379)
RBC: 3.73 x10E6/uL — AB (ref 3.77–5.28)
RDW: 13.2 % (ref 12.3–15.4)
RPR Ser Ql: NONREACTIVE
WBC: 6.9 10*3/uL (ref 3.4–10.8)

## 2016-07-14 ENCOUNTER — Ambulatory Visit (INDEPENDENT_AMBULATORY_CARE_PROVIDER_SITE_OTHER): Payer: BC Managed Care – PPO | Admitting: Obstetrics and Gynecology

## 2016-07-14 VITALS — BP 116/70 | Wt 134.0 lb

## 2016-07-14 DIAGNOSIS — Z23 Encounter for immunization: Secondary | ICD-10-CM | POA: Diagnosis not present

## 2016-07-14 DIAGNOSIS — Z6711 Type A blood, Rh negative: Secondary | ICD-10-CM

## 2016-07-14 DIAGNOSIS — O4440 Low lying placenta NOS or without hemorrhage, unspecified trimester: Secondary | ICD-10-CM

## 2016-07-14 DIAGNOSIS — Z3490 Encounter for supervision of normal pregnancy, unspecified, unspecified trimester: Secondary | ICD-10-CM

## 2016-07-14 DIAGNOSIS — Z3A30 30 weeks gestation of pregnancy: Secondary | ICD-10-CM

## 2016-07-14 DIAGNOSIS — O09299 Supervision of pregnancy with other poor reproductive or obstetric history, unspecified trimester: Secondary | ICD-10-CM

## 2016-07-14 NOTE — Progress Notes (Signed)
No vb. No lof. TDAP today.  °

## 2016-07-28 ENCOUNTER — Ambulatory Visit (INDEPENDENT_AMBULATORY_CARE_PROVIDER_SITE_OTHER): Payer: BC Managed Care – PPO | Admitting: Obstetrics and Gynecology

## 2016-07-28 VITALS — BP 118/72 | Wt 136.0 lb

## 2016-07-28 DIAGNOSIS — Z3490 Encounter for supervision of normal pregnancy, unspecified, unspecified trimester: Secondary | ICD-10-CM

## 2016-07-28 DIAGNOSIS — Z3A32 32 weeks gestation of pregnancy: Secondary | ICD-10-CM

## 2016-07-28 DIAGNOSIS — O09299 Supervision of pregnancy with other poor reproductive or obstetric history, unspecified trimester: Secondary | ICD-10-CM

## 2016-07-28 DIAGNOSIS — Z6711 Type A blood, Rh negative: Secondary | ICD-10-CM

## 2016-07-28 NOTE — Progress Notes (Signed)
Prenatal Visit Note Date: 07/28/2016 Clinic: [facility]  Subjective:  Monica Shaffer is a 31 y.o. G2P1001 at [redacted]w[redacted]d being seen today for ongoing prenatal care.  She is currently monitored for the following issues for this low-risk pregnancy and has Psoriasis; Type A blood, Rh negative; Family history of breast cancer; Second pregnancy; H/O shoulder dystocia in prior pregnancy, currently pregnant; and Low-lying placenta on her problem list.  Patient reports no bleeding, no contractions, no cramping and no leaking.    . Vag. Bleeding: None.  Movement: Present. Denies leaking of fluid.   The following portions of the patient's history were reviewed and updated as appropriate: allergies, current medications, past family history, past medical history, past social history, past surgical history and problem list. Problem list updated.  Objective:   Vitals:   07/28/16 1347  BP: 118/72  Weight: 136 lb (61.7 kg)    Fetal Status: Fetal Heart Rate (bpm): 145 Fundal Height: 32 cm Movement: Present     General:  Alert, oriented and cooperative. Patient is in no acute distress.  Skin: Skin is warm and dry. No rash noted.   Cardiovascular: Normal heart rate noted  Respiratory: Normal respiratory effort, no problems with respiration noted  Abdomen: Soft, gravid, appropriate for gestational age. Pain/Pressure: Absent     Pelvic:  Cervical exam deferred        Extremities: Normal range of motion.  Edema: None  Mental Status: Normal mood and affect. Normal behavior. Normal judgment and thought content.   Urinalysis: Urine Protein: Negative Urine Glucose: Negative  Assessment and Plan:  Pregnancy: G2P1001 at [redacted]w[redacted]d  1. Type A blood, Rh negative -s/p rhogam 2. Second pregnancy - US OB Follow Up; Future  3. H/O shoulder dystocia in prior pregnancy, currently pregnant - US OB Follow Up; Future  (u/s for growth assessment). Discussed risk of shoulder dystocia with this pregnancy 4. [redacted] weeks  gestation of pregnancy  Preterm labor symptoms and general obstetric precautions including but not limited to vaginal bleeding, contractions, leaking of fluid and fetal movement were reviewed in detail with the patient. Please refer to After Visit Summary for other counseling recommendations.  Return in about 2 weeks (around 08/11/2016) for schedule u/s for growth and routine prenatal after.   Prentice Docker, MD 07/28/2016 2:23 PM

## 2016-08-06 ENCOUNTER — Telehealth: Payer: Self-pay

## 2016-08-06 NOTE — Telephone Encounter (Signed)
Pt 34wks, 31yr old has had stomach bug, pt and hsb woke up with it this morning.  Should she see PCP or be seen here?  [phone]  Line busy.  Spoke to pt.  Adv sip clear liquids x24hrs, then bland diet x24hrs, the work up to reg diet.  To be seen if doesn't keep liquids down x24hrs.

## 2016-08-12 ENCOUNTER — Ambulatory Visit (INDEPENDENT_AMBULATORY_CARE_PROVIDER_SITE_OTHER): Payer: BC Managed Care – PPO | Admitting: Obstetrics and Gynecology

## 2016-08-12 ENCOUNTER — Ambulatory Visit (INDEPENDENT_AMBULATORY_CARE_PROVIDER_SITE_OTHER): Payer: BC Managed Care – PPO

## 2016-08-12 VITALS — BP 118/74 | Wt 139.0 lb

## 2016-08-12 DIAGNOSIS — Z3490 Encounter for supervision of normal pregnancy, unspecified, unspecified trimester: Secondary | ICD-10-CM | POA: Diagnosis not present

## 2016-08-12 DIAGNOSIS — Z3A34 34 weeks gestation of pregnancy: Secondary | ICD-10-CM

## 2016-08-12 DIAGNOSIS — O09299 Supervision of pregnancy with other poor reproductive or obstetric history, unspecified trimester: Secondary | ICD-10-CM | POA: Diagnosis not present

## 2016-08-12 DIAGNOSIS — Z6711 Type A blood, Rh negative: Secondary | ICD-10-CM

## 2016-08-12 DIAGNOSIS — Z362 Encounter for other antenatal screening follow-up: Secondary | ICD-10-CM

## 2016-08-12 NOTE — Progress Notes (Signed)
Prenatal Visit Note Date: 08/12/2016 Clinic: [facility]  Subjective:  Monica Shaffer is a 31 y.o. G2P1001 at [redacted]w[redacted]d being seen today for ongoing prenatal care.  She is currently monitored for the following issues for this low-risk pregnancy and has Psoriasis; Type A blood, Rh negative; Family history of breast cancer; Second pregnancy; H/O shoulder dystocia in prior pregnancy, currently pregnant; and Low-lying placenta on her problem list.   ----------------------------------------------------------------------------------- Patient reports no bleeding, no contractions and no leaking.    . Vag. Bleeding: None.  Movement: Present. Denies leaking of fluid.  Growth u/s today shows 2,859 grams (6 lb 5oz) 74th %ile, AFI 10cm.  AC > 97.7% ([redacted]w[redacted]d) ----------------------------------------------------------------------------------- The following portions of the patient's history were reviewed and updated as appropriate: allergies, current medications, past family history, past medical history, past social history, past surgical history and problem list. Problem list updated.  Objective:   Vitals:   08/12/16 1450  BP: 118/74  Weight: 139 lb (63 kg)   Total weight gain for pregnancy: 29 lb (13.2 kg)   Fetal Status: Fetal Heart Rate (bpm): 150 Fundal Height: 35 cm Movement: Present     General:  Alert, oriented and cooperative. Patient is in no acute distress.  Skin: Skin is warm and dry. No rash noted.   Cardiovascular: Normal heart rate noted  Respiratory: Normal respiratory effort, no problems with respiration noted  Abdomen: Soft, gravid, appropriate for gestational age. Pain/Pressure: Absent     Pelvic:  Cervical exam deferred        Extremities: Normal range of motion.     Mental Status: Normal mood and affect. Normal behavior. Normal judgment and thought content.   Urinalysis: Urine Protein: Negative Urine Glucose: Negative  Assessment and Plan:  Pregnancy: G2P1001 at [redacted]w[redacted]d  1.  Second pregnancy 2. Type A blood, Rh negative 3. [redacted] weeks gestation of pregnancy 4. H/O shoulder dystocia in prior pregnancy, currently pregnant - Will obtain 1 week worth of finger-stick glucose at home, fasting and 2h pp with 1h gtt of 138.   - discussed risk of shoulder dystocia with h/o shoulder dystocia of G1 with 7 lb infant. Current EFW is 6lb 5 oz at 34 weeks.  - repeat growth u/s at 37 weeks.  Preterm labor symptoms and general obstetric precautions including but not limited to vaginal bleeding, contractions, leaking of fluid and fetal movement were reviewed in detail with the patient. Please refer to After Visit Summary for other counseling recommendations.   Return in about 2 weeks (around 08/26/2016) for Routine Prenatal Appointment.  Prentice Docker, MD 08/12/2016 5:48 PM

## 2016-08-12 NOTE — Patient Instructions (Signed)
Check you blood glucose values: 1) fasting (first thing in the morning before eating or drinking) - target is 60-95 mg/dL 2) 2 hours after each meal (breakfast, lunch, and dinner) - target is < 120 mg/dL  Please let me know what results you have gotten after 1 week.

## 2016-08-14 ENCOUNTER — Encounter: Payer: Self-pay | Admitting: Obstetrics and Gynecology

## 2016-08-24 ENCOUNTER — Telehealth: Payer: Self-pay

## 2016-08-24 NOTE — Telephone Encounter (Signed)
FMLA/DISABILITY form for [employer] filled out for pt's spouse and given to [place] for processing.

## 2016-08-26 ENCOUNTER — Ambulatory Visit (INDEPENDENT_AMBULATORY_CARE_PROVIDER_SITE_OTHER): Payer: BC Managed Care – PPO | Admitting: Obstetrics and Gynecology

## 2016-08-26 VITALS — BP 120/70 | Wt 139.0 lb

## 2016-08-26 DIAGNOSIS — Z3490 Encounter for supervision of normal pregnancy, unspecified, unspecified trimester: Secondary | ICD-10-CM

## 2016-08-26 DIAGNOSIS — O24410 Gestational diabetes mellitus in pregnancy, diet controlled: Secondary | ICD-10-CM

## 2016-08-26 DIAGNOSIS — O4440 Low lying placenta NOS or without hemorrhage, unspecified trimester: Secondary | ICD-10-CM

## 2016-08-26 DIAGNOSIS — O24419 Gestational diabetes mellitus in pregnancy, unspecified control: Secondary | ICD-10-CM | POA: Insufficient documentation

## 2016-08-26 DIAGNOSIS — O09299 Supervision of pregnancy with other poor reproductive or obstetric history, unspecified trimester: Secondary | ICD-10-CM

## 2016-08-26 DIAGNOSIS — Z3A36 36 weeks gestation of pregnancy: Secondary | ICD-10-CM

## 2016-08-26 DIAGNOSIS — Z6711 Type A blood, Rh negative: Secondary | ICD-10-CM

## 2016-08-26 NOTE — Progress Notes (Signed)
Routine Prenatal Care Visit  Subjective  Monica Shaffer is a 31 y.o. G2P1001 at [redacted]w[redacted]d being seen today for ongoing prenatal care.  She is currently monitored for the following issues for this high-risk pregnancy and has Psoriasis; Type A blood, Rh negative; Family history of breast cancer; Second pregnancy; H/O shoulder dystocia in prior pregnancy, currently pregnant; Low-lying placenta; and Gestational diabetes on her problem list.  ----------------------------------------------------------------------------------- Patient reports no bleeding, no contractions and no leaking.    . Vag. Bleeding: None.  Movement: Present. Denies leaking of fluid.  Home blood glucose showed 50% elevated fastings during week 1 of records, week 2 was totally normal after dietary adjustments.  ----------------------------------------------------------------------------------- The following portions of the patient's history were reviewed and updated as appropriate: allergies, current medications, past family history, past medical history, past social history, past surgical history and problem list. Problem list updated.   Objective  Blood pressure 120/70, weight 139 lb (63 kg), last menstrual period 12/10/2015. Pregravid weight 110 lb (49.9 kg) Total Weight Gain 29 lb (13.2 kg) Urinalysis: Urine Protein: Negative Urine Glucose: Negative  Fetal Status: Fetal Heart Rate (bpm): 145 Fundal Height: 38 cm Movement: Present     General:  Alert, oriented and cooperative. Patient is in no acute distress.  Skin: Skin is warm and dry. No rash noted.   Cardiovascular: Normal heart rate noted  Respiratory: Normal respiratory effort, no problems with respiration noted  Abdomen: Soft, gravid, appropriate for gestational age. Pain/Pressure: Absent     Pelvic:  Cervical exam deferred        Extremities: Normal range of motion.  Edema: Moderate pitting, indentation subsides rapidly  Mental Status: Normal mood and affect. Normal  behavior. Normal judgment and thought content.   Assessment   31 y.o. G2P1001 at [redacted]w[redacted]d by  09/20/2016, by Ultrasound presenting for routine prenatal visit  Plan   Pregnancy#2 Problems (from 12/10/15 to present)    Problem Noted Resolved   Gestational diabetes 08/26/2016 by Will Bonnet, MD No   Second pregnancy 05/01/2016 by Dalia Heading, CNM No   Overview Signed 06/29/2016  9:14 AM by Will Bonnet, MD    Clinic [facility] Prenatal Labs  Dating 7 wk u/s Blood type: A/Negative/-- (01/22 0000)   Genetic Screen 1 Screen: declined Antibody:Negative (01/22 0000)  Anatomic [place] complete Rubella: Immune (01/22 0000) Varicella: I  GTT Early: n/a Third trim: 138 RPR: Nonreactive (01/22 0000)   Rhogam [x]  28 wks HBsAg: Negative (01/22 0000)   TDaP vaccine                       Flu Shot: HIV: Non-reactive (01/22 0000)   Baby Food                                GBS:   Contraception  Pap:  CBB     CS/VBAC    Support Person             H/O shoulder dystocia in prior pregnancy, currently pregnant 05/01/2016 by Dalia Heading, CNM No   Overview Addendum 08/26/2016  5:51 PM by Will Bonnet, MD    - growth at 34 weeks, 78th%ile, c/w 6 lb 5 oz, AC>97 %ile - Brief shoulder dystocia with G1, weight 7 pounds - long discussion regarding risks/benefits of attempting vaginal delivery with this delivery with EFW projected to be 8.5-9 lbs.  - Patient would like to request elective  c-section - Primary cesarean section 9/4 with Dr Glennon Mac - repeat growth u/s at 37-38 weeks - home blood glucose monitoring showed 50% of fasting values > target.  Completely corrected with diet adjustment at 35-36 weeks.        Low-lying placenta 05/01/2016 by Dalia Heading, CNM No   Overview Addendum 06/29/2016  9:12 AM by Will Bonnet, MD    posterior placenta 2.08 cm from cervix [x]  resolved      Type A blood, Rh negative 04/07/2016 by Doristine Counter, CMA No   Overview Signed 08/26/2016  5:46  PM by Will Bonnet, MD    [x]  rhogam 28 weeks [ ]  rhogam prior to discharge postpartum if infant rh negative         Preterm labor symptoms and general obstetric precautions including but not limited to vaginal bleeding, contractions, leaking of fluid and fetal movement were reviewed in detail with the patient. Please refer to After Visit Summary for other counseling recommendations.   Return in 12 days (on 09/07/2016) for schedule growth u/s and ROB DR Glennon Mac (may force onto schedule).  Prentice Docker, MD  08/26/2016 5:52 PM

## 2016-08-27 ENCOUNTER — Telehealth: Payer: Self-pay | Admitting: Obstetrics and Gynecology

## 2016-08-27 NOTE — Telephone Encounter (Signed)
Patient is aware of H&P at [facility] on Friday, 09/11/16 @ 11:30am w/ Dr. Glennon Mac, Pre-admit Testing afterwards, and OR on 09/15/16. Ext given.

## 2016-08-27 NOTE — Telephone Encounter (Signed)
-----   Message from Will Bonnet, MD sent at 08/26/2016  5:49 PM EDT ----- Regarding: Schedule c-section Surgery Booking Request Patient Full Name: Monica Shaffer   MRN: [id number]  DOB: 11-09-1985  Surgeon: Prentice Docker, MD  Requested Surgery Date and Time: 09/15/16 Primary Diagnosis and Code: history of shoulder dystocia, gestational diabetes Secondary Diagnosis and Code:  Surgical Procedure: Cesarean Section L&D Notification: Yes Admission Status: surgery admit Length of Surgery: 60 minutes Special Case Needs: none H&P: tbd (date) Phone Interview or Office Pre-Admit: pre-admit Interpreter: n/a Language: engl Medical Clearance: none Special Scheduling Instructions: none

## 2016-08-28 LAB — GC/CHLAMYDIA PROBE AMP
Chlamydia trachomatis, NAA: NEGATIVE
NEISSERIA GONORRHOEAE BY PCR: NEGATIVE

## 2016-08-28 LAB — STREP GP B NAA: Strep Gp B NAA: NEGATIVE

## 2016-09-04 ENCOUNTER — Ambulatory Visit (INDEPENDENT_AMBULATORY_CARE_PROVIDER_SITE_OTHER): Payer: BC Managed Care – PPO

## 2016-09-04 DIAGNOSIS — Z362 Encounter for other antenatal screening follow-up: Secondary | ICD-10-CM | POA: Diagnosis not present

## 2016-09-04 DIAGNOSIS — Z3490 Encounter for supervision of normal pregnancy, unspecified, unspecified trimester: Secondary | ICD-10-CM

## 2016-09-04 DIAGNOSIS — O09299 Supervision of pregnancy with other poor reproductive or obstetric history, unspecified trimester: Secondary | ICD-10-CM | POA: Diagnosis not present

## 2016-09-07 ENCOUNTER — Ambulatory Visit (INDEPENDENT_AMBULATORY_CARE_PROVIDER_SITE_OTHER): Payer: BC Managed Care – PPO | Admitting: Obstetrics and Gynecology

## 2016-09-07 VITALS — BP 118/74 | Wt 140.0 lb

## 2016-09-07 DIAGNOSIS — Z6711 Type A blood, Rh negative: Secondary | ICD-10-CM

## 2016-09-07 DIAGNOSIS — O4440 Low lying placenta NOS or without hemorrhage, unspecified trimester: Secondary | ICD-10-CM

## 2016-09-07 DIAGNOSIS — O09299 Supervision of pregnancy with other poor reproductive or obstetric history, unspecified trimester: Secondary | ICD-10-CM

## 2016-09-07 DIAGNOSIS — Z3490 Encounter for supervision of normal pregnancy, unspecified, unspecified trimester: Secondary | ICD-10-CM

## 2016-09-07 DIAGNOSIS — O24410 Gestational diabetes mellitus in pregnancy, diet controlled: Secondary | ICD-10-CM

## 2016-09-07 DIAGNOSIS — Z3A38 38 weeks gestation of pregnancy: Secondary | ICD-10-CM

## 2016-09-07 NOTE — Progress Notes (Signed)
Routine Prenatal Care Visit  Subjective  Monica Shaffer is a 31 y.o. G2P1001 at [redacted]w[redacted]d being seen today for ongoing prenatal care.  She is currently monitored for the following issues for this high-risk pregnancy and has Psoriasis; Type A blood, Rh negative; Family history of breast cancer; Second pregnancy; H/O shoulder dystocia in prior pregnancy, currently pregnant; Low-lying placenta; and Gestational diabetes on her problem list.  ----------------------------------------------------------------------------------- Patient reports no complaints.    . Vag. Bleeding: None.  Movement: Present. Denies leaking of fluid.  Reviewed u/s with growth at 87th%ile, AFI 13 cm.  EFW 8 lb 5 oz.  Blood glucose log today with all normal values with good diet control.   ----------------------------------------------------------------------------------- The following portions of the patient's history were reviewed and updated as appropriate: allergies, current medications, past family history, past medical history, past social history, past surgical history and problem list. Problem list updated.   Objective  Blood pressure 118/74, weight 140 lb (63.5 kg), last menstrual period 12/10/2015. Pregravid weight 110 lb (49.9 kg) Total Weight Gain 30 lb (13.6 kg) Urinalysis: Urine Protein: Negative Urine Glucose: Negative  Fetal Status: Fetal Heart Rate (bpm): 145   Movement: Present     General:  Alert, oriented and cooperative. Patient is in no acute distress.  Skin: Skin is warm and dry. No rash noted.   Cardiovascular: Normal heart rate noted  Respiratory: Normal respiratory effort, no problems with respiration noted  Abdomen: Soft, gravid, appropriate for gestational age. Pain/Pressure: Absent     Pelvic:  Cervical exam deferred        Extremities: Normal range of motion.  Edema: None  Mental Status: Normal mood and affect. Normal behavior. Normal judgment and thought content.   Assessment  31 y.o.  G2P1001 at [redacted]w[redacted]d by  09/20/2016, by Ultrasound presenting for routine prenatal visit  Plan   Pregnancy#2 Problems (from 12/10/15 to present)    Problem Noted Resolved   Gestational diabetes 08/26/2016 by Will Bonnet, MD No   Second pregnancy 05/01/2016 by Dalia Heading, CNM No   Overview Addendum 08/28/2016 12:37 PM by Will Bonnet, MD    [facility]inic [facility] Prenatal Labs  Dating 7 wk u/s Blood type: A/Negative/-- (01/22 0000)   Genetic Screen 1 Screen: declined Antibody:Negative (06/18 4132)  Anatomic [place] complete Rubella: Immune (01/22 0000) Varicella: I  GTT Early: n/a Third trim:  RPR: Non Reactive (06/18 0853)   Rhogam [ ]  28 wks HBsAg: Negative (01/22 0000)   TDaP vaccine  07/14/16        Flu Shot: HIV: Non-reactive (01/22 0000)   Baby Food                                [id number] (08/15 1553)  Contraception  Pap:  CBB     CS/VBAC Primary c-section planned   Support Person             H/O shoulder dystocia in prior pregnancy, currently pregnant 05/01/2016 by Dalia Heading, CNM No   Overview Addendum 09/07/2016  6:15 PM by Will Bonnet, MD    - growth at 34 weeks, 78th%ile, c/w 6 lb 5 oz, AC>97 %ile - Brief shoulder dystocia with G1, weight 7 pounds - long discussion regarding risks/benefits of attempting vaginal delivery with this delivery with EFW projected to be 8.5-9 lbs.  - Patient would like to request elective c-section - Primary cesarean section 9/4 with Dr Glennon Mac - repeat growth  u/s at 37-38 weeks  - growth 86.7th%ile, AC > 97.7 %ile, EFW 8lb 5 oz.  - home blood glucose monitoring showed 50% of fasting values > target.  Completely corrected with diet adjustment at 35-36 weeks.       Low-lying placenta 05/01/2016 by Dalia Heading, CNM No   Overview Addendum 06/29/2016  9:12 AM by Will Bonnet, MD    posterior placenta 2.08 cm from cervix [x]  resolved     Type A blood, Rh negative 04/07/2016 by Doristine Counter, CMA No   Overview Signed  08/26/2016  5:46 PM by Will Bonnet, MD    [x]  rhogam 28 weeks [ ]  rhogam prior to discharge postpartum if infant rh negative        Term labor symptoms and general obstetric precautions including but not limited to vaginal bleeding, contractions, leaking of fluid and fetal movement were reviewed in detail with the patient. Please refer to After Visit Summary for other counseling recommendations.   Return in about 1 week (around 09/14/2016) for Routine Prenatal Appointment, keep previously scheduled appt.  Prentice Docker, MD  09/07/2016 6:15 PM

## 2016-09-11 ENCOUNTER — Encounter: Payer: Self-pay | Admitting: Obstetrics and Gynecology

## 2016-09-11 ENCOUNTER — Ambulatory Visit (INDEPENDENT_AMBULATORY_CARE_PROVIDER_SITE_OTHER): Payer: BC Managed Care – PPO | Admitting: Obstetrics and Gynecology

## 2016-09-11 ENCOUNTER — Encounter
Admission: RE | Admit: 2016-09-11 | Discharge: 2016-09-11 | Disposition: A | Discharge status: Home / Self Care | Payer: BC Managed Care – PPO | Source: Ambulatory Visit | Attending: Obstetrics and Gynecology | Admitting: Obstetrics and Gynecology

## 2016-09-11 VITALS — BP 118/74 | Ht 61.0 in | Wt 139.0 lb

## 2016-09-11 DIAGNOSIS — Z3A38 38 weeks gestation of pregnancy: Secondary | ICD-10-CM

## 2016-09-11 DIAGNOSIS — Z6711 Type A blood, Rh negative: Secondary | ICD-10-CM

## 2016-09-11 DIAGNOSIS — Z3490 Encounter for supervision of normal pregnancy, unspecified, unspecified trimester: Secondary | ICD-10-CM

## 2016-09-11 DIAGNOSIS — O09299 Supervision of pregnancy with other poor reproductive or obstetric history, unspecified trimester: Secondary | ICD-10-CM

## 2016-09-11 DIAGNOSIS — O24410 Gestational diabetes mellitus in pregnancy, diet controlled: Secondary | ICD-10-CM

## 2016-09-11 DIAGNOSIS — Z3A00 Weeks of gestation of pregnancy not specified: Secondary | ICD-10-CM | POA: Insufficient documentation

## 2016-09-11 DIAGNOSIS — O4440 Low lying placenta NOS or without hemorrhage, unspecified trimester: Secondary | ICD-10-CM

## 2016-09-11 DIAGNOSIS — Z01818 Encounter for other preprocedural examination: Secondary | ICD-10-CM | POA: Insufficient documentation

## 2016-09-11 DIAGNOSIS — Z3A39 39 weeks gestation of pregnancy: Secondary | ICD-10-CM | POA: Insufficient documentation

## 2016-09-11 LAB — CBC
HCT: 32.1 % — ABNORMAL LOW (ref 35.0–47.0)
Hemoglobin: 10.6 g/dL — ABNORMAL LOW (ref 12.0–16.0)
MCH: 25.3 pg — ABNORMAL LOW (ref 26.0–34.0)
MCHC: 33 g/dL (ref 32.0–36.0)
MCV: 76.9 fL — ABNORMAL LOW (ref 80.0–100.0)
PLATELETS: 189 10*3/uL (ref 150–440)
RBC: 4.18 MIL/uL (ref 3.80–5.20)
RDW: 17.9 % — AB (ref 11.5–14.5)
WBC: 7.2 10*3/uL (ref 3.6–11.0)

## 2016-09-11 LAB — RAPID HIV SCREEN (HIV 1/2 AB+AG)
HIV 1/2 Antibodies: NONREACTIVE
HIV-1 P24 ANTIGEN - HIV24: NONREACTIVE

## 2016-09-11 NOTE — Progress Notes (Signed)
OB History & Physical   History of Present Illness:  Chief Complaint: Here for surgery pre-operative visit  HPI:  Monica Shaffer is a 31 y.o. G2P1001 female at [redacted]w[redacted]d dated by 7 week ultrasound.  Her pregnancy has been complicated by rh negative status, history of shoulder dystocia with G1, gestational diabetes (diet controlled).    She denies contractions.   She denies leakage of fluid.   She denies vaginal bleeding.   She reports fetal movement.  She had a minor shoulder dystocia with the birth of her first child, who weighed 7 pounds.  He is not functionally impaired.  She has gained a normal amount of weight with this pregnancy. However, her most recent growth scan shows that the estimated fetal weight of her current fetus is 8.5 pounds (one week ago).  The [facility] measurement has consistently been > 97.7%ile.  She kept a log of her blood glucose several weeks ago that showed 50% of her fasting blood glucose values were above target range.  Her 1 hour   Glucose tolerance test result was 138.  She was given a diagnosis of gestational diabetes for this reason.  With diet changes, her blood glucose values have normalized.  After discussion with the patient regarding risk for shoulder dystocia, she elects a primary c-section for prevention of shoulder dystocia given the estimated size of her fetus.   Maternal Medical History:   Past Medical History:  Diagnosis Date  . ASCUS with positive high risk HPV cervical 10/05/2008  . Cervical high risk HPV (human papillomavirus) test positive 05/08/2011  . Family history of breast cancer   . HPV (human papilloma virus) anogenital infection   . Mitral valve prolapse   . Papanicolaou smear of anus with low grade squamous intraepithelial lesion (LGSIL) 05/06/2011  . Type A blood, Rh negative     Past Surgical History:  Procedure Laterality Date  . COLPOSCOPY  03/2008  . CYST EXCISION     Tonsil    No Known Allergies  Prior to Admission medications    Medication Sig Start Date End Date Taking? Authorizing Provider  Iron-FA-B Cmp-C-Biot-Probiotic (FUSION PLUS PO) Take 1 capsule by mouth daily with lunch.   Yes [provider]  prenatal vitamin w/FE, FA (PRENATAL 1 + 1) 27-1 MG TABS tablet Take 1 tablet by mouth daily with supper.    Yes [provider]  acetaminophen (TYLENOL) 500 MG tablet Take 1,000 mg by mouth every 6 (six) hours as needed (for headaches.).    [provider]    OB History  Gravida Para Term Preterm AB Living  2 1 1     1   SAB TAB Ectopic Multiple Live Births          1    # Outcome Date GA Lbr Len/2nd Weight Sex Delivery Anes PTL Lv  2 Current           1 Term 12/04/13 [redacted]w[redacted]d  7 lb (3.175 kg) M    LIV     Complications: Shoulder Dystocia     Birth Comments: nucal arm      Prenatal care site: [facility]  Social History: She  reports that she has never smoked. She has never used smokeless tobacco. She reports that she does not drink alcohol or use drugs.  Family History: family history includes Breast cancer (age of onset: 98) in her paternal aunt; Cancer in her paternal grandfather; Prostate cancer in her maternal grandfather; Prostate cancer (age of  onset: 42) in her paternal uncle; Stroke in her maternal grandmother.   Review of Systems: Negative x 10 systems reviewed except as noted in the HPI.    Physical Exam:  Vital Signs: BP 118/74   Ht 5\' 1"  (1.549 m)   Wt 139 lb (63 kg)   LMP 12/10/2015   BMI 26.26 kg/m  Constitutional: Well nourished, well developed female in no acute distress.  HEENT: normal Skin: Warm and dry.  Cardiovascular: Regular rate and rhythm.   Extremity: no edema  Respiratory: Clear to auscultation bilateral. Normal respiratory effort Abdomen: FHT present and gravid, NT Back: no CVAT Neuro: DTRs 2+, Cranial nerves grossly intact Psych: Alert and Oriented x3. No memory deficits. Normal mood and affect.  MS: normal gait, normal bilateral lower  extremity ROM/strength/stability. FHR: 135 bpm  Pertinent Results:  Prenatal Labs: Blood type/Rh A negative  Antibody screen negative  Rubella Immune  Varicella Immune    RPR NR  HBsAg negative  HIV negative  GC negative  Chlamydia negative  Genetic screening Declined   1 hour GTT 138  3 hour GTT n/a  GBS negative on 08/26/16  TDaP on 07/14/16  Assessment:  Monica Shaffer is a 31 y.o. G47P1001 female at [redacted]w[redacted]d here for preoperative evaluation for primary c-section due to history of shoulder dystocia with G1 (infant weighed 7 pounds), estimated fetal weight of current fetus is 9 pounds and she has gestational diabetes.  After much discussion of risk and benefits of either attempting vaginal delivery or moving straight to c-section, mutual decision made to proceed with primary c-section.   Plan:  1. Admit to Labor & Delivery on 09/15/16 2. CBC, T&S, NPO, IVF 3. GBS negative.   4. Preop abx and VTE prophylaxis ordered.     Prentice Docker, MD 09/11/2016 12:18 PM

## 2016-09-11 NOTE — Patient Instructions (Signed)
  Your procedure is scheduled on: 09/15/16 Report to ED at 5:30 .  Remember: Instructions that are not followed completely may result in serious medical risk, up to and including death, or upon the discretion of your surgeon and anesthesiologist your surgery may need to be rescheduled.    __x__ 1. Do not eat food after midnight the night before your procedure. No gum chewing or hard candies. You may drink clear liquids up to 2 hours before you are scheduled to arrive for your surgery- DO not drink clear liquids within 2 hours of the start of your surgery.  Clear Liquids include: water, apple juice without pulp, clear carbohydrate drink such as Clearfast of Gartorade, Black Coffee or Tea (Do not add anything to coffee or tea).    ____ 2. No Alcohol for 24 hours before or after surgery.   ____ 3. Do Not Smoke For 24 Hours Prior to Your Surgery.   ____ 4. Bring all medications with you on the day of surgery if instructed.    __x__ 5. Notify your doctor if there is any change in your medical condition     (cold, fever, infections).       Do not wear jewelry, make-up, hairpins, clips or nail polish.  Do not wear lotions, powders, or perfumes. You may wear deodorant.  Do not shave 48 hours prior to surgery. Men may shave face and neck.  Do not bring valuables to the hospital.    [facility] is not responsible for any belongings or valuables.               Contacts, dentures or bridgework may not be worn into surgery.  Leave your suitcase in the car. After surgery it may be brought to your room.  For patients admitted to the hospital, discharge time is determined by your                treatment team.   Patients discharged the day of surgery will not be allowed to drive home.   Please read over the following fact sheets that you were given:      ____ Take these medicines the morning of surgery with A SIP OF WATER:    1. none  2.   3.   4.  5.  6.  ____ Fleet Enema  (as directed)   _x___ Use CHG Soap as directed  ____ Use inhalers on the day of surgery  ____ Stop metformin 2 days prior to surgery    ____ Take 1/2 of usual insulin dose the night before surgery and none on the morning of surgery.   ____ Stop Coumadin/Plavix/aspirin on   ____ Stop Anti-inflammatories on   ____ Stop supplements until after surgery.    ____ Bring C-Pap to the hospital.

## 2016-09-12 LAB — RPR: RPR Ser Ql: NONREACTIVE

## 2016-09-14 LAB — TYPE AND SCREEN
ABO/RH(D): A NEG
Antibody Screen: POSITIVE
EXTEND SAMPLE REASON: UNDETERMINED

## 2016-09-14 MED ORDER — CEFAZOLIN SODIUM-DEXTROSE 2-4 GM/100ML-% IV SOLN
2.0000 g | INTRAVENOUS | Status: AC
  Administered 2016-09-15: 2 g via INTRAVENOUS
  Filled 2016-09-14 (×2): qty 100

## 2016-09-15 ENCOUNTER — Encounter
Admission: RE | Disposition: A | Discharge status: Home / Self Care | Payer: Self-pay | Source: Ambulatory Visit | Attending: Obstetrics and Gynecology

## 2016-09-15 ENCOUNTER — Inpatient Hospital Stay
Admission: RE | Admit: 2016-09-15 | Discharge: 2016-09-17 | DRG: 765 | Disposition: A | Discharge status: Home / Self Care | Payer: BC Managed Care – PPO | Source: Ambulatory Visit | Attending: Obstetrics and Gynecology | Admitting: Obstetrics and Gynecology

## 2016-09-15 ENCOUNTER — Inpatient Hospital Stay: Payer: BC Managed Care – PPO | Admitting: Certified Registered Nurse Anesthetist

## 2016-09-15 DIAGNOSIS — O4440 Low lying placenta NOS or without hemorrhage, unspecified trimester: Secondary | ICD-10-CM

## 2016-09-15 DIAGNOSIS — Z6791 Unspecified blood type, Rh negative: Secondary | ICD-10-CM | POA: Diagnosis not present

## 2016-09-15 DIAGNOSIS — O24410 Gestational diabetes mellitus in pregnancy, diet controlled: Secondary | ICD-10-CM

## 2016-09-15 DIAGNOSIS — O24420 Gestational diabetes mellitus in childbirth, diet controlled: Secondary | ICD-10-CM | POA: Diagnosis present

## 2016-09-15 DIAGNOSIS — O26893 Other specified pregnancy related conditions, third trimester: Principal | ICD-10-CM | POA: Diagnosis present

## 2016-09-15 DIAGNOSIS — O09299 Supervision of pregnancy with other poor reproductive or obstetric history, unspecified trimester: Secondary | ICD-10-CM

## 2016-09-15 DIAGNOSIS — O9081 Anemia of the puerperium: Secondary | ICD-10-CM | POA: Diagnosis not present

## 2016-09-15 DIAGNOSIS — D62 Acute posthemorrhagic anemia: Secondary | ICD-10-CM | POA: Diagnosis not present

## 2016-09-15 DIAGNOSIS — Z3A39 39 weeks gestation of pregnancy: Secondary | ICD-10-CM

## 2016-09-15 DIAGNOSIS — Z3490 Encounter for supervision of normal pregnancy, unspecified, unspecified trimester: Secondary | ICD-10-CM

## 2016-09-15 DIAGNOSIS — Z3A38 38 weeks gestation of pregnancy: Secondary | ICD-10-CM | POA: Diagnosis not present

## 2016-09-15 DIAGNOSIS — O24419 Gestational diabetes mellitus in pregnancy, unspecified control: Secondary | ICD-10-CM | POA: Diagnosis present

## 2016-09-15 DIAGNOSIS — Z6711 Type A blood, Rh negative: Secondary | ICD-10-CM

## 2016-09-15 DIAGNOSIS — Z98891 History of uterine scar from previous surgery: Secondary | ICD-10-CM

## 2016-09-15 HISTORY — DX: Gestational diabetes mellitus in pregnancy, unspecified control: O24.419

## 2016-09-15 LAB — GLUCOSE, CAPILLARY: Glucose-Capillary: 101 mg/dL — ABNORMAL HIGH (ref 65–99)

## 2016-09-15 SURGERY — Surgical Case
Anesthesia: Spinal | Wound class: Clean Contaminated

## 2016-09-15 MED ORDER — PRENATAL PLUS 27-1 MG PO TABS
1.0000 | ORAL_TABLET | Freq: Every day | ORAL | Status: DC
  Administered 2016-09-15 – 2016-09-16 (×2): 1 via ORAL
  Filled 2016-09-15 (×2): qty 1

## 2016-09-15 MED ORDER — MEPERIDINE HCL 25 MG/ML IJ SOLN: 6.2500 mg | INTRAMUSCULAR | Status: DC | PRN

## 2016-09-15 MED ORDER — OXYTOCIN 40 UNITS IN LACTATED RINGERS INFUSION - SIMPLE MED
INTRAVENOUS | Status: DC | PRN
  Administered 2016-09-15: 100 mL via INTRAVENOUS
  Administered 2016-09-15: 1000 mL via INTRAVENOUS

## 2016-09-15 MED ORDER — ONDANSETRON HCL 4 MG/2ML IJ SOLN: 4.0000 mg | Freq: Three times a day (TID) | INTRAMUSCULAR | Status: DC | PRN

## 2016-09-15 MED ORDER — DIPHENHYDRAMINE HCL 25 MG PO CAPS: 25.0000 mg | ORAL_CAPSULE | Freq: Four times a day (QID) | ORAL | Status: DC | PRN

## 2016-09-15 MED ORDER — MORPHINE SULFATE (PF) 0.5 MG/ML IJ SOLN
INTRAMUSCULAR | Status: DC | PRN
Start: 2016-09-15 — End: 2016-09-15
  Administered 2016-09-15: .2 mg via EPIDURAL

## 2016-09-15 MED ORDER — DIPHENHYDRAMINE HCL 25 MG PO CAPS: 25.0000 mg | ORAL_CAPSULE | ORAL | Status: DC | PRN

## 2016-09-15 MED ORDER — OXYCODONE-ACETAMINOPHEN 5-325 MG PO TABS
2.0000 | ORAL_TABLET | ORAL | Status: DC | PRN
  Administered 2016-09-16 – 2016-09-17 (×6): 2 via ORAL
  Filled 2016-09-15 (×6): qty 2

## 2016-09-15 MED ORDER — SENNOSIDES-DOCUSATE SODIUM 8.6-50 MG PO TABS: 2.0000 | ORAL_TABLET | ORAL | Status: DC

## 2016-09-15 MED ORDER — LACTATED RINGERS IV SOLN
INTRAVENOUS | Status: DC
  Administered 2016-09-15 – 2016-09-16 (×2): via INTRAVENOUS

## 2016-09-15 MED ORDER — OXYTOCIN 40 UNITS IN LACTATED RINGERS INFUSION - SIMPLE MED: 2.5000 [IU]/h | INTRAVENOUS | Status: AC

## 2016-09-15 MED ORDER — SIMETHICONE 80 MG PO CHEW
80.0000 mg | CHEWABLE_TABLET | Freq: Three times a day (TID) | ORAL | Status: DC
  Administered 2016-09-15 – 2016-09-17 (×6): 80 mg via ORAL
  Filled 2016-09-15 (×6): qty 1

## 2016-09-15 MED ORDER — PROPOFOL 10 MG/ML IV BOLUS
INTRAVENOUS | Status: AC
Start: 2016-09-15 — End: ?
  Filled 2016-09-15: qty 20

## 2016-09-15 MED ORDER — DIBUCAINE 1 % RE OINT: 1.0000 "application " | TOPICAL_OINTMENT | RECTAL | Status: DC | PRN

## 2016-09-15 MED ORDER — IBUPROFEN 600 MG PO TABS
600.0000 mg | ORAL_TABLET | Freq: Four times a day (QID) | ORAL | Status: DC
  Administered 2016-09-16 – 2016-09-17 (×5): 600 mg via ORAL
  Filled 2016-09-15 (×5): qty 1

## 2016-09-15 MED ORDER — NALOXONE HCL 0.4 MG/ML IJ SOLN: 0.4000 mg | INTRAMUSCULAR | Status: DC | PRN

## 2016-09-15 MED ORDER — BUPIVACAINE HCL 0.5 % IJ SOLN
INTRAMUSCULAR | Status: DC | PRN
  Administered 2016-09-15: 10 mL

## 2016-09-15 MED ORDER — ACETAMINOPHEN 500 MG PO TABS
1000.0000 mg | ORAL_TABLET | Freq: Four times a day (QID) | ORAL | Status: AC
  Administered 2016-09-15 – 2016-09-16 (×4): 1000 mg via ORAL
  Filled 2016-09-15 (×4): qty 2

## 2016-09-15 MED ORDER — SOD CITRATE-CITRIC ACID 500-334 MG/5ML PO SOLN
30.0000 mL | ORAL | Status: AC
  Administered 2016-09-15: 30 mL via ORAL
  Filled 2016-09-15: qty 15

## 2016-09-15 MED ORDER — LACTATED RINGERS IV SOLN
INTRAVENOUS | Status: DC
  Administered 2016-09-15: 08:00:00 via INTRAVENOUS

## 2016-09-15 MED ORDER — EPHEDRINE SULFATE 50 MG/ML IJ SOLN
INTRAMUSCULAR | Status: DC | PRN
  Administered 2016-09-15 (×4): 5 mg via INTRAVENOUS

## 2016-09-15 MED ORDER — BUPIVACAINE 0.25 % ON-Q PUMP DUAL CATH 400 ML
400.0000 mL | INJECTION | Status: DC
  Filled 2016-09-15: qty 400

## 2016-09-15 MED ORDER — SENNOSIDES-DOCUSATE SODIUM 8.6-50 MG PO TABS
2.0000 | ORAL_TABLET | ORAL | Status: DC
  Administered 2016-09-16 – 2016-09-17 (×2): 2 via ORAL
  Filled 2016-09-15 (×2): qty 2

## 2016-09-15 MED ORDER — COCONUT OIL OIL: 1.0000 "application " | TOPICAL_OIL | Status: DC | PRN

## 2016-09-15 MED ORDER — WITCH HAZEL-GLYCERIN EX PADS: 1.0000 "application " | MEDICATED_PAD | CUTANEOUS | Status: DC | PRN

## 2016-09-15 MED ORDER — BUPIVACAINE HCL (PF) 0.5 % IJ SOLN
5.0000 mL | Freq: Once | INTRAMUSCULAR | Status: DC
  Filled 2016-09-15: qty 30

## 2016-09-15 MED ORDER — ONDANSETRON HCL 4 MG/2ML IJ SOLN: 4.0000 mg | Freq: Once | INTRAMUSCULAR | Status: DC | PRN

## 2016-09-15 MED ORDER — MENTHOL 3 MG MT LOZG
1.0000 | LOZENGE | OROMUCOSAL | Status: DC | PRN
  Filled 2016-09-15: qty 9

## 2016-09-15 MED ORDER — OXYTOCIN 40 UNITS IN LACTATED RINGERS INFUSION - SIMPLE MED
INTRAVENOUS | Status: AC
  Filled 2016-09-15: qty 1000

## 2016-09-15 MED ORDER — KETOROLAC TROMETHAMINE 30 MG/ML IJ SOLN: 30.0000 mg | Freq: Four times a day (QID) | INTRAMUSCULAR | Status: AC | PRN

## 2016-09-15 MED ORDER — NALBUPHINE HCL 10 MG/ML IJ SOLN: 5.0000 mg | INTRAMUSCULAR | Status: DC | PRN

## 2016-09-15 MED ORDER — BUPIVACAINE HCL (PF) 0.5 % IJ SOLN
5.0000 mL | Freq: Once | INTRAMUSCULAR | Status: DC
Start: 2016-09-15 — End: 2016-09-15

## 2016-09-15 MED ORDER — FERROUS SULFATE 325 (65 FE) MG PO TABS
325.0000 mg | ORAL_TABLET | Freq: Two times a day (BID) | ORAL | Status: DC
  Administered 2016-09-15 – 2016-09-17 (×4): 325 mg via ORAL
  Filled 2016-09-15 (×4): qty 1

## 2016-09-15 MED ORDER — OXYCODONE-ACETAMINOPHEN 5-325 MG PO TABS: 1.0000 | ORAL_TABLET | ORAL | Status: DC | PRN

## 2016-09-15 MED ORDER — ONDANSETRON HCL 4 MG/2ML IJ SOLN
INTRAMUSCULAR | Status: DC | PRN
  Administered 2016-09-15: 4 mg via INTRAVENOUS

## 2016-09-15 MED ORDER — BUPIVACAINE IN DEXTROSE 0.75-8.25 % IT SOLN
INTRATHECAL | Status: DC | PRN
  Administered 2016-09-15: 1.6 mL via INTRATHECAL

## 2016-09-15 MED ORDER — SODIUM CHLORIDE 0.9 % IV SOLN
INTRAVENOUS | Status: DC | PRN
  Administered 2016-09-15: 75 ug/min via INTRAVENOUS

## 2016-09-15 MED ORDER — FENTANYL CITRATE (PF) 100 MCG/2ML IJ SOLN: 25.0000 ug | INTRAMUSCULAR | Status: DC | PRN

## 2016-09-15 MED ORDER — DIPHENHYDRAMINE HCL 50 MG/ML IJ SOLN
12.5000 mg | INTRAMUSCULAR | Status: DC | PRN
  Administered 2016-09-16 (×2): 12.5 mg via INTRAVENOUS
  Filled 2016-09-15 (×2): qty 1

## 2016-09-15 MED ORDER — MORPHINE SULFATE (PF) 0.5 MG/ML IJ SOLN
INTRAMUSCULAR | Status: AC
  Filled 2016-09-15: qty 10

## 2016-09-15 MED ORDER — SODIUM CHLORIDE 0.9% FLUSH: 3.0000 mL | INTRAVENOUS | Status: DC | PRN

## 2016-09-15 MED ORDER — KETOROLAC TROMETHAMINE 30 MG/ML IJ SOLN
30.0000 mg | Freq: Four times a day (QID) | INTRAMUSCULAR | Status: AC | PRN
  Administered 2016-09-16: 30 mg via INTRAVENOUS
  Filled 2016-09-15: qty 1

## 2016-09-15 SURGICAL SUPPLY — 32 items
CANISTER SUCT 3000ML PPV (MISCELLANEOUS) ×3 IMPLANT
CATH KIT ON-Q SILVERSOAK 5IN (CATHETERS) ×6 IMPLANT
CLOSURE WOUND 1/2 X4 (GAUZE/BANDAGES/DRESSINGS)
DERMABOND ADVANCED (GAUZE/BANDAGES/DRESSINGS) ×2
DERMABOND ADVANCED .7 DNX12 (GAUZE/BANDAGES/DRESSINGS) ×1 IMPLANT
DRSG OPSITE POSTOP 4X10 (GAUZE/BANDAGES/DRESSINGS) ×6 IMPLANT
DRSG TELFA 3X8 NADH (GAUZE/BANDAGES/DRESSINGS) IMPLANT
ELECT CAUTERY BLADE 6.4 (BLADE) ×3 IMPLANT
ELECT REM PT RETURN 9FT ADLT (ELECTROSURGICAL) ×3
ELECTRODE REM PT RTRN 9FT ADLT (ELECTROSURGICAL) ×1 IMPLANT
GAUZE SPONGE 4X4 12PLY STRL (GAUZE/BANDAGES/DRESSINGS) IMPLANT
GLOVE BIO SURGEON STRL SZ7 (GLOVE) ×9 IMPLANT
GLOVE INDICATOR 7.5 STRL GRN (GLOVE) ×9 IMPLANT
GOWN STRL REUS W/ TWL LRG LVL3 (GOWN DISPOSABLE) ×3 IMPLANT
GOWN STRL REUS W/TWL LRG LVL3 (GOWN DISPOSABLE) ×6
NS IRRIG 1000ML POUR BTL (IV SOLUTION) ×3 IMPLANT
PACK C SECTION AR (MISCELLANEOUS) ×3 IMPLANT
PAD OB MATERNITY 4.3X12.25 (PERSONAL CARE ITEMS) ×6 IMPLANT
PAD PREP 24X41 OB/GYN DISP (PERSONAL CARE ITEMS) ×3 IMPLANT
SPONGE LAP 18X18 5 PK (GAUZE/BANDAGES/DRESSINGS) ×3 IMPLANT
STRIP CLOSURE SKIN 1/2X4 (GAUZE/BANDAGES/DRESSINGS) IMPLANT
SUT CHROMIC GUT BROWN 0 54 (SUTURE) IMPLANT
SUT CHROMIC GUT BROWN 0 54IN (SUTURE)
SUT MNCRL 4-0 (SUTURE) ×2
SUT MNCRL 4-0 27XMFL (SUTURE) ×1
SUT PDS AB 1 TP1 96 (SUTURE) ×3 IMPLANT
SUT PLAIN GUT 0 (SUTURE) IMPLANT
SUT VIC AB 0 CTX 36 (SUTURE) ×6
SUT VIC AB 0 CTX36XBRD ANBCTRL (SUTURE) ×3 IMPLANT
SUT VICRYL 3-0 27IN SH (SUTURE) ×3 IMPLANT
SUTURE MNCRL 4-0 27XMF (SUTURE) ×1 IMPLANT
SWABSTK COMLB BENZOIN TINCTURE (MISCELLANEOUS) IMPLANT

## 2016-09-15 NOTE — Interval H&P Note (Signed)
History and Physical Interval Note:  09/15/2016 7:23 AM  Monica Shaffer  has presented today for surgery, with the diagnosis of HISTORY OF SHOULDER DYSTOCIA,GESTATIONAL DIABETES  The various methods of treatment have been discussed with the patient and family. After consideration of risks, benefits and other options for treatment, the patient has consented to  Procedure(s): CESAREAN SECTION (N/A) as a surgical intervention .  The patient's history has been reviewed, patient examined, no change in status, stable for surgery.  I have reviewed the patient's chart and labs.  Questions were answered to the patient's satisfaction.     Prentice Docker, MD 09/15/2016 7:23 AM

## 2016-09-15 NOTE — Anesthesia Preprocedure Evaluation (Signed)
Anesthesia Evaluation  Patient identified by MRN, date of birth, ID band Patient awake    Reviewed: Allergy & Precautions, NPO status , Patient's Chart, lab work & pertinent test results, reviewed documented beta blocker date and time   Airway Mallampati: II  TM Distance: >3 FB     Dental  (+) Chipped   Pulmonary           Cardiovascular      Neuro/Psych    GI/Hepatic   Endo/Other  diabetes  Renal/GU      Musculoskeletal   Abdominal   Peds  Hematology   Anesthesia Other Findings   Reproductive/Obstetrics                             Anesthesia Physical Anesthesia Plan  ASA: II  Anesthesia Plan: Spinal   Post-op Pain Management:    Induction:   PONV Risk Score and Plan:   Airway Management Planned:   Additional Equipment:   Intra-op Plan:   Post-operative Plan:   Informed Consent: I have reviewed the patients History and Physical, chart, labs and discussed the procedure including the risks, benefits and alternatives for the proposed anesthesia with the patient or authorized representative who has indicated his/her understanding and acceptance.     Plan Discussed with: CRNA  Anesthesia Plan Comments:         Anesthesia Quick Evaluation

## 2016-09-15 NOTE — Progress Notes (Signed)
FSBS 101

## 2016-09-15 NOTE — Transfer of Care (Signed)
Immediate Anesthesia Transfer of Care Note  Patient: Monica Shaffer  Procedure(s) Performed: Procedure(s): CESAREAN SECTION (N/A)  Patient Location: PACU and Mother/Baby  Anesthesia Type:Spinal  Level of Consciousness: awake, alert  and oriented  Airway & Oxygen Therapy: Patient Spontanous Breathing  Post-op Assessment: Report given to RN and Post -op Vital signs reviewed and stable  Post vital signs: Reviewed and stable  Last Vitals:  Vitals:   09/15/16 0607  BP: 114/73  Pulse: 69  Resp: 18  Temp: 36.8 C    Last Pain:  Vitals:   09/15/16 0611  TempSrc:   PainSc: 0-No pain         Complications: No apparent anesthesia complications

## 2016-09-15 NOTE — Discharge Summary (Signed)
OB Discharge Summary     Patient Name: Monica Shaffer DOB: 06-26-1985 MRN: [id number]  Date of admission: 09/15/2016 Delivering MD: Prentice Docker, MD  Date of Delivery: 09/15/2016  Date of discharge: 09/17/2016  Admitting diagnosis: HISTORY OF SHOULDER DYSTOCIA,GESTATIONAL DIABETES Intrauterine pregnancy: [redacted]w[redacted]d     Secondary diagnosis: None     Discharge diagnosis: Term Pregnancy Delivered and GDM A1                                                                                                Post partum procedures:rhogam  Augmentation: n/a  Complications: None  Hospital course:  Sceduled C/S   31 y.o. yo G2P2002 at [redacted]w[redacted]d was admitted to the hospital 09/15/2016 for scheduled cesarean section with the following indication:history of shoulder dystocia (G1 with weight of 7 pounds), gestational diabetes, estimated fetal weight 9 pounds.  Membrane Rupture Time/Date:   ,    Patient delivered a Viable infant.09/15/2016  Details of operation can be found in separate operative note.  Pateint had an uncomplicated postpartum course.  She is ambulating, tolerating a regular diet, passing flatus, and urinating well. Patient is discharged home in stable condition on  09/17/16         Physical exam  Vitals:   09/16/16 1946 09/16/16 2347 09/17/16 0428 09/17/16 0802  BP: 107/62   119/71  Pulse: 81   92  Resp: 20   20  Temp: 97.7 F (36.5 C) 98.2 F (36.8 C) 98.1 F (36.7 C) 98 F (36.7 C)  TempSrc: Oral Oral Oral Oral  SpO2: 100%   100%  Weight:      Height:       General: alert, cooperative and no distress Lochia: appropriate Uterine Fundus: firm Incision: Healing well with no significant drainage DVT Evaluation: No evidence of DVT seen on physical exam.  Labs: Lab Results  Component Value Date   WBC 10.9 09/16/2016   HGB 8.5 (L) 09/16/2016   HCT 26.0 (L) 09/16/2016   MCV 79.1 (L) 09/16/2016   PLT 123 (L) 09/16/2016    Discharge instruction: per After Visit  Summary.  Medications:  Allergies as of 09/17/2016   No Known Allergies     Medication List    STOP taking these medications   acetaminophen 500 MG tablet Commonly known as:  TYLENOL     TAKE these medications   FUSION PLUS PO Take 1 capsule by mouth daily with lunch.   ibuprofen 600 MG tablet Commonly known as:  ADVIL,MOTRIN Take 1 tablet (600 mg total) by mouth every 6 (six) hours.   oxyCODONE-acetaminophen 5-325 MG tablet Commonly known as:  PERCOCET/ROXICET Take 1 tablet by mouth every 6 (six) hours as needed for moderate pain or severe pain (pain score 4-7/10).   prenatal vitamin w/FE, FA 27-1 MG Tabs tablet Take 1 tablet by mouth daily with supper.            Discharge Care Instructions        Start     Ordered   09/17/16 0000  oxyCODONE-acetaminophen (PERCOCET/ROXICET) 5-325 MG tablet  Every 6 hours PRN  09/17/16 0929   09/17/16 0000  Call MD for:  persistant nausea and vomiting     09/17/16 0929   09/17/16 0000  Call MD for:  severe uncontrolled pain     09/17/16 0929   09/17/16 0000  Call MD for:  redness, tenderness, or signs of infection (pain, swelling, redness, odor or green/yellow discharge around incision site)     09/17/16 0929   09/17/16 0000  Call MD for:  difficulty breathing, headache or visual disturbances     09/17/16 0929   09/17/16 0000  Call MD for:  persistant dizziness or light-headedness     09/17/16 0929   09/17/16 0000  Activity as tolerated     09/17/16 0929   09/17/16 0000  Lifting restrictions    Comments:  Weight restriction of 10 lbs. May lift/carry baby.   09/17/16 0929   09/17/16 0000  Driving restriction    Comments:  Avoid driving for at least 1 weeks. Do not drive on narcotics.   09/17/16 0929   09/17/16 0000  Sexual acrtivity    Comments:  Please refrain from intercourse until after you are cleared by your doctor/midwife at your 6 week visit.   09/17/16 0929   09/17/16 0000  Discharge wound care:    Comments:   Keep incision dry, clean.   09/17/16 0929   09/17/16 0000  Diet general     09/17/16 0929   09/17/16 0000  ibuprofen (ADVIL,MOTRIN) 600 MG tablet  Every 6 hours     09/17/16 0929      Diet: routine diet  Activity: Advance as tolerated. Pelvic rest for 6 weeks.   Outpatient follow up: Follow-up Information    Will Bonnet, MD Follow up in 1 week(s).   Specialty:  Obstetrics and Gynecology Why:  post op incision check Contact information: [address] [place] [address] [phone]             Postpartum contraception: Condoms Rhogam Given postpartum: yes Rubella vaccine given postpartum: no Varicella vaccine given postpartum: no TDaP given antepartum or postpartum: AP on 07/14/16  Newborn Data: Live born female  Birth Weight: 7 lb 13.6 oz (3560 g) APGAR: 8, 9  Baby Feeding: Bottle  Disposition:home with mother  SIGNED: Rod Can, CNM

## 2016-09-15 NOTE — H&P (View-Only) (Signed)
OB History & Physical   History of Present Illness:  Chief Complaint: Here for surgery pre-operative visit  HPI:  Monica Shaffer is a 31 y.o. G2P1001 female at [redacted]w[redacted]d dated by 7 week ultrasound.  Her pregnancy has been complicated by rh negative status, history of shoulder dystocia with G1, gestational diabetes (diet controlled).    She denies contractions.   She denies leakage of fluid.   She denies vaginal bleeding.   She reports fetal movement.  She had a minor shoulder dystocia with the birth of her first child, who weighed 7 pounds.  He is not functionally impaired.  She has gained a normal amount of weight with this pregnancy. However, her most recent growth scan shows that the estimated fetal weight of her current fetus is 8.5 pounds (one week ago).  The [facility] measurement has consistently been > 97.7%ile.  She kept a log of her blood glucose several weeks ago that showed 50% of her fasting blood glucose values were above target range.  Her 1 hour   Glucose tolerance test result was 138.  She was given a diagnosis of gestational diabetes for this reason.  With diet changes, her blood glucose values have normalized.  After discussion with the patient regarding risk for shoulder dystocia, she elects a primary c-section for prevention of shoulder dystocia given the estimated size of her fetus.   Maternal Medical History:   Past Medical History:  Diagnosis Date  . ASCUS with positive high risk HPV cervical 10/05/2008  . Cervical high risk HPV (human papillomavirus) test positive 05/08/2011  . Family history of breast cancer   . HPV (human papilloma virus) anogenital infection   . Mitral valve prolapse   . Papanicolaou smear of anus with low grade squamous intraepithelial lesion (LGSIL) 05/06/2011  . Type A blood, Rh negative     Past Surgical History:  Procedure Laterality Date  . COLPOSCOPY  03/2008  . CYST EXCISION     Tonsil    No Known Allergies  Prior to Admission medications    Medication Sig Start Date End Date Taking? Authorizing Provider  Iron-FA-B Cmp-C-Biot-Probiotic (FUSION PLUS PO) Take 1 capsule by mouth daily with lunch.   Yes [provider]  prenatal vitamin w/FE, FA (PRENATAL 1 + 1) 27-1 MG TABS tablet Take 1 tablet by mouth daily with supper.    Yes [provider]  acetaminophen (TYLENOL) 500 MG tablet Take 1,000 mg by mouth every 6 (six) hours as needed (for headaches.).    [provider]    OB History  Gravida Para Term Preterm AB Living  2 1 1     1   SAB TAB Ectopic Multiple Live Births          1    # Outcome Date GA Lbr Len/2nd Weight Sex Delivery Anes PTL Lv  2 Current           1 Term 12/04/13 [redacted]w[redacted]d  7 lb (3.175 kg) M    LIV     Complications: Shoulder Dystocia     Birth Comments: nucal arm      Prenatal care site: [facility]  Social History: She  reports that she has never smoked. She has never used smokeless tobacco. She reports that she does not drink alcohol or use drugs.  Family History: family history includes Breast cancer (age of onset: 57) in her paternal aunt; Cancer in her paternal grandfather; Prostate cancer in her maternal grandfather; Prostate cancer (age of  onset: 64) in her paternal uncle; Stroke in her maternal grandmother.   Review of Systems: Negative x 10 systems reviewed except as noted in the HPI.    Physical Exam:  Vital Signs: BP 118/74   Ht 5\' 1"  (1.549 m)   Wt 139 lb (63 kg)   LMP 12/10/2015   BMI 26.26 kg/m  Constitutional: Well nourished, well developed female in no acute distress.  HEENT: normal Skin: Warm and dry.  Cardiovascular: Regular rate and rhythm.   Extremity: no edema  Respiratory: Clear to auscultation bilateral. Normal respiratory effort Abdomen: FHT present and gravid, NT Back: no CVAT Neuro: DTRs 2+, Cranial nerves grossly intact Psych: Alert and Oriented x3. No memory deficits. Normal mood and affect.  MS: normal gait, normal bilateral lower  extremity ROM/strength/stability. FHR: 135 bpm  Pertinent Results:  Prenatal Labs: Blood type/Rh A negative  Antibody screen negative  Rubella Immune  Varicella Immune    RPR NR  HBsAg negative  HIV negative  GC negative  Chlamydia negative  Genetic screening Declined   1 hour GTT 138  3 hour GTT n/a  GBS negative on 08/26/16  TDaP on 07/14/16  Assessment:  Monica Shaffer is a 31 y.o. G28P1001 female at [redacted]w[redacted]d here for preoperative evaluation for primary c-section due to history of shoulder dystocia with G1 (infant weighed 7 pounds), estimated fetal weight of current fetus is 9 pounds and she has gestational diabetes.  After much discussion of risk and benefits of either attempting vaginal delivery or moving straight to c-section, mutual decision made to proceed with primary c-section.   Plan:  1. Admit to Labor & Delivery on 09/15/16 2. CBC, T&S, NPO, IVF 3. GBS negative.   4. Preop abx and VTE prophylaxis ordered.     Prentice Docker, MD 09/11/2016 12:18 PM

## 2016-09-15 NOTE — Anesthesia Post-op Follow-up Note (Signed)
Anesthesia QCDR form completed.        

## 2016-09-15 NOTE — Op Note (Signed)
Cesarean Section Operative Note    Monica Shaffer   09/15/2016   Pre-operative Diagnosis:  1) intrauterine pregnancy at [redacted]w[redacted]d  2) History of shoulder dystocia 3) Gestational diabetes, diet controlled  Post-operative Diagnosis:  1) intrauterine pregnancy at [redacted]w[redacted]d  2) History of shoulder dystocia 3) Gestational diabetes, diet controlled  Procedure: Primary Low Transverse Cesarean Section via pfannenstiel incision with double-layer uterine closure  Surgeon: Surgeon(s) and Role:    Will Bonnet, MD - Primary   Anesthesia: spinal   Findings:  1) normal appearing gravid uterus, fallopian tubes, and ovaries 2) viable female infant with weight 3,560 grams (7lb 14 oz), APGARS 8 and 9   Estimated Blood Loss: 800 mL  Total IV Fluids: 1,800 ml   Urine Output: 450 mL clear urine  Specimens: none  Complications: no complications  Disposition: PACU - hemodynamically stable.   Maternal Condition: stable   Baby condition / location:  Couplet care / Skin to Skin  Procedure Details:  The patient was seen in the Holding Room. The risks, benefits, complications, treatment options, and expected outcomes were discussed with the patient. The patient concurred with the proposed plan, giving informed consent. identified as Monica Shaffer and the procedure verified as C-Section Delivery. A Time Out was held and the above information confirmed.   After induction of anesthesia, the patient was draped and prepped in the usual sterile manner. A Pfannenstiel incision was made and carried down through the subcutaneous tissue to the fascia. Fascial incision was made and extended transversely. The fascia was separated from the underlying rectus tissue superiorly and inferiorly. The peritoneum was identified and entered. Peritoneal incision was extended longitudinally. The bladder flap was not freed from the lower uterine segment. A low transverse uterine incision was made and the hysterotomy was  extended with cranial-caudal tension. Delivered from cephalic presentation was a 3,560 gram Living newborn infant(s) or Female with Apgar scores of 8 at one minute and 9 at five minutes. Cord ph was not sent the umbilical cord was clamped and cut cord blood was obtained for evaluation. The placenta was removed Intact and appeared normal. The uterine outline, tubes and ovaries appeared normal. The uterine incision was closed with running locked sutures of 0 Vicryl.  A second layer of the same suture was thrown in an imbricating fashion.  Hemostasis was assured.  The uterus was returned to the abdomen and the paracolic gutters were cleared of all clots and debris.  The peritoneum was reapproximated using 0-Vicryl in a running fashion.  The rectus muscles were inspected and found to be hemostatic.  The On-Q catheter pumps were inserted in accordance with the manufacturer's recommendations.  The catheters were inserted approximately 4cm cephelad to the incision line, approximately 1cm apart, straddling the midline.  They were inserted to a depth of the 4th mark. They were positioned superficial to the rectus abdominus muscles and deep to the rectus fascia.    The fascia was then reapproximated with running sutures of 1-0 PDS, looped. Three interrupted 3-0 vicryl stitches were thrown in the subcutaneous tissue along the incision to provide reinforcement to the skin closure.  The subcuticular closure was performed using 4-0 monocryl. The skin closure was reinforced using surgical skin glue.  The On-Q catheters were bolused with 5 mL of 0.5% marcaine plain for a total of 10 mL.  The catheters were affixed to the skin with surgical skin glue, steri-strips, and tegaderm.    Instrument, sponge, and needle counts were correct prior the  abdominal closure and were correct at the conclusion of the case.  The patient received Ancef 2 gram IV prior to skin incision (within 30 minutes). For VTE prophylaxis she was wearing  SCDs throughout the case.   Signed: Will Bonnet, MD 09/15/2016 9:04 AM

## 2016-09-15 NOTE — Telephone Encounter (Signed)
This encounter was created in error - please disregard.

## 2016-09-15 NOTE — Anesthesia Procedure Notes (Signed)
Spinal  Patient location during procedure: OR Staffing Anesthesiologist: Marica Trentham Performed: anesthesiologist  Preanesthetic Checklist Completed: patient identified, site marked, surgical consent, pre-op evaluation, timeout performed, IV checked and risks and benefits discussed Spinal Block Patient position: sitting Prep: Betadine Patient monitoring: heart rate, cardiac monitor, continuous pulse ox and blood pressure Approach: midline Location: L3-4 Injection technique: single-shot Needle Needle type: Pencil-Tip  Needle gauge: 25 G Needle length: 9 cm Assessment Sensory level: T10     

## 2016-09-16 ENCOUNTER — Encounter: Payer: Self-pay | Admitting: Obstetrics and Gynecology

## 2016-09-16 LAB — TYPE AND SCREEN
ABO/RH(D): A NEG
ANTIBODY SCREEN: POSITIVE
UNIT DIVISION: 0
Unit division: 0

## 2016-09-16 LAB — CBC
HCT: 26 % — ABNORMAL LOW (ref 35.0–47.0)
HEMOGLOBIN: 8.5 g/dL — AB (ref 12.0–16.0)
MCH: 25.9 pg — AB (ref 26.0–34.0)
MCHC: 32.7 g/dL (ref 32.0–36.0)
MCV: 79.1 fL — ABNORMAL LOW (ref 80.0–100.0)
Platelets: 123 10*3/uL — ABNORMAL LOW (ref 150–440)
RBC: 3.29 MIL/uL — AB (ref 3.80–5.20)
RDW: 19.1 % — ABNORMAL HIGH (ref 11.5–14.5)
WBC: 10.9 10*3/uL (ref 3.6–11.0)

## 2016-09-16 LAB — BPAM RBC
BLOOD PRODUCT EXPIRATION DATE: 201809242359
BLOOD PRODUCT EXPIRATION DATE: 201809242359
UNIT TYPE AND RH: 600
UNIT TYPE AND RH: 600

## 2016-09-16 LAB — FETAL SCREEN: Fetal Screen: NEGATIVE

## 2016-09-16 MED ORDER — RHO D IMMUNE GLOBULIN 1500 UNIT/2ML IJ SOSY
300.0000 ug | PREFILLED_SYRINGE | Freq: Once | INTRAMUSCULAR | Status: AC
Start: 2016-09-16 — End: 2016-09-16
  Administered 2016-09-16: 300 ug via INTRAVENOUS
  Filled 2016-09-16: qty 2

## 2016-09-16 NOTE — Anesthesia Postprocedure Evaluation (Signed)
Anesthesia Post Note  Patient: Monica Shaffer  Procedure(s) Performed: Procedure(s) (LRB): CESAREAN SECTION (N/A)  Patient location during evaluation: Mother Baby Anesthesia Type: Spinal Level of consciousness: oriented and awake and alert Pain management: pain level controlled Vital Signs Assessment: post-procedure vital signs reviewed and stable Respiratory status: spontaneous breathing, respiratory function stable and patient connected to nasal cannula oxygen Cardiovascular status: blood pressure returned to baseline and stable Postop Assessment: no headache and no backache Anesthetic complications: no     Last Vitals:  Vitals:   09/16/16 0003 09/16/16 0416  BP: 96/60 (!) 103/58  Pulse: 67 64  Resp: 18 18  Temp: 36.8 C 36.7 C  SpO2: 97% 100%    Last Pain:  Vitals:   09/16/16 0616  TempSrc:   PainSc: 3                  Demya Scruggs,  Clearnce Sorrel

## 2016-09-16 NOTE — Anesthesia Post-op Follow-up Note (Signed)
  Anesthesia Pain Follow-up Note  Patient: Monica Shaffer  Day #: 1  Date of Follow-up: 09/16/2016 Time: 7:34 AM  Last Vitals:  Vitals:   09/16/16 0003 09/16/16 0416  BP: 96/60 (!) 103/58  Pulse: 67 64  Resp: 18 18  Temp: 36.8 C 36.7 C  SpO2: 97% 100%    Level of Consciousness: alert  Pain: mild   Side Effects:None  Catheter Site Exam:clean, dry     Plan: D/C from anesthesia care at surgeon's request  Estill Batten

## 2016-09-16 NOTE — Progress Notes (Signed)
Postpartum Progress Note  Subjective:  Alert, NAD, denies pain currently. Tolerating a regular diet, voiding without difficulty, ambulating well.  Objective:  Blood pressure 105/71, pulse 82, temperature 98 F (36.7 C), temperature source Oral, resp. rate 18, height 5\' 1"  (1.549 m), weight 139 lb (63 kg), last menstrual period 12/10/2015, SpO2 100 %  General: NAD Pulmonary: no increased work of breathing Abdomen: non-distended, non-tender, fundus firm at level of umbilicus Incision: dressing C/D/I Extremities: no edema, no erythema, no tenderness  Results for orders placed or performed during the hospital encounter of 09/15/16 (from the past 72 hour(s))  Type and screen [place]     Status: None (Preliminary result)   Collection Time: 09/15/16  6:25 AM  Result Value Ref Range   ABO/RH(D) A NEG    Antibody Screen POS    Sample Expiration 09/18/2016    Antibody Identification PASSIVELY ACQUIRED ANTI-D    Unit Number [id number]    Blood Component Type RED CELLS,LR    Unit division 00    Status of Unit ALLOCATED    Transfusion Status OK TO TRANSFUSE    Crossmatch Result COMPATIBLE    Unit Number [id number]    Blood Component Type RED CELLS,LR    Unit division 00    Status of Unit ALLOCATED    Transfusion Status OK TO TRANSFUSE    Crossmatch Result COMPATIBLE   Glucose, capillary     Status: Abnormal   Collection Time: 09/15/16  9:34 AM  Result Value Ref Range   Glucose-Capillary 101 (H) 65 - 99 mg/dL  Fetal screen     Status: None   Collection Time: 09/16/16  5:44 AM  Result Value Ref Range   Fetal Screen NEG   Rhogam injection     Status: None (Preliminary result)   Collection Time: 09/16/16  5:44 AM  Result Value Ref Range   Unit Number [id number]    Blood Component Type RHIG    Unit division 00    Status of Unit ALLOCATED    Transfusion Status OK TO TRANSFUSE   CBC     Status: Abnormal   Collection Time: 09/16/16  5:44 AM  Result  Value Ref Range   WBC 10.9 3.6 - 11.0 K/uL   RBC 3.29 (L) 3.80 - 5.20 MIL/uL   Hemoglobin 8.5 (L) 12.0 - 16.0 g/dL   HCT 26.0 (L) 35.0 - 47.0 %   MCV 79.1 (L) 80.0 - 100.0 fL   MCH 25.9 (L) 26.0 - 34.0 pg   MCHC 32.7 32.0 - 36.0 g/dL   RDW 19.1 (H) 11.5 - 14.5 %   Platelets 123 (L) 150 - 440 K/uL    Assessment:   31 y.o. [id number] postoperative day # 1 is recovering well.  Plan:  1) Acute blood loss anemia - hemodynamically stable and asymptomatic - po ferrous sulfate  2) Blood Type --/--/A NEG (09/04 2440)  Information for the patient's newborn:  Antoinette, Borgwardt [[id number]]  A POS Rhogam is ordered  3) Rubella Immune (01/22 0000) / Varicella Immune / TDAP status: received antepartum  4) Formula feeding  5) Contraception: unknown  6) Disposition: continue postpartum care.  Avel Sensor, CNM 09/16/2016  9:20 AM

## 2016-09-17 LAB — RHOGAM INJECTION: Unit division: 0

## 2016-09-17 MED ORDER — OXYCODONE-ACETAMINOPHEN 5-325 MG PO TABS: 1.0000 | ORAL_TABLET | Freq: Four times a day (QID) | ORAL | 0 refills | Status: DC | PRN

## 2016-09-17 MED ORDER — IBUPROFEN 600 MG PO TABS: 600.0000 mg | ORAL_TABLET | Freq: Four times a day (QID) | ORAL | 0 refills | Status: DC

## 2016-09-17 NOTE — Discharge Instructions (Signed)
Please call your doctor or return to the ER if you experience any chest pains, shortness of breath, dizziness, visual changes, fever greater than 101, any heavy bleeding (saturating more than 1 pad per hour), large clots, or foul smelling discharge, any worsening abdominal pain and cramping that is not controlled by pain medication, or any signs of postpartum depression. No tampons, enemas, douches, or sexual intercourse for 6 weeks. Also avoid tub baths, hot tubs, or swimming for 6 weeks.    Check your incision daily for any signs of infection such as redness, warmth, swelling, increased pain, pus or foul smelling drainage   Activity: do not lift over 10 lbs for 6 weeks No driving for 1-2 weeks  Pelvic rest for 6 weeks

## 2016-09-17 NOTE — Progress Notes (Signed)
Discharge order received from doctor. Reviewed discharge instructions and prescriptions with patient and answered all questions. On-Q pump removal instructions given. Incision cleaning kit given. Follow up appointment  given. Patient verbalized understanding. ID bands checked. Patient discharged home with infant via wheelchair by nursing/auxillary.    Hilbert Bible, RN

## 2016-09-21 ENCOUNTER — Encounter: Payer: Self-pay | Admitting: Obstetrics and Gynecology

## 2016-09-21 ENCOUNTER — Other Ambulatory Visit: Payer: Self-pay | Admitting: Obstetrics and Gynecology

## 2016-09-21 DIAGNOSIS — Z98891 History of uterine scar from previous surgery: Secondary | ICD-10-CM

## 2016-09-21 MED ORDER — FUSION PLUS PO CAPS: 1.0000 | ORAL_CAPSULE | Freq: Every day | ORAL | 2 refills | Status: DC

## 2016-09-28 ENCOUNTER — Encounter: Payer: Self-pay | Admitting: Obstetrics and Gynecology

## 2016-09-28 ENCOUNTER — Ambulatory Visit (INDEPENDENT_AMBULATORY_CARE_PROVIDER_SITE_OTHER): Payer: BC Managed Care – PPO | Admitting: Obstetrics and Gynecology

## 2016-09-28 VITALS — BP 118/74 | Wt 120.0 lb

## 2016-09-28 DIAGNOSIS — Z98891 History of uterine scar from previous surgery: Secondary | ICD-10-CM

## 2016-09-28 NOTE — Progress Notes (Signed)
   Postoperative Follow-up Patient presents post op from cesarean section  13days ago.  Subjective: She denies fever, chills, nausea and vomiting. Eating a regular diet without difficulty. The patient is not having any pain.  Activity: normal activities of daily living. She denies issues with her incision.    Objective: BP 118/74   Wt 120 lb (54.4 kg)   BMI 22.67 kg/m   Constitutional: Well nourished, well developed female in no acute distress.  HEENT: normal Skin: Warm and dry.  Abdomen: soft, NT, ND, uterine fundus at U-4 clean, dry, intact and no erythema, induration, warmth, and tenderness Extremity: no edema   Assessment: 31 y.o. s/p cesarean section progressing well  Plan: Patient has done well after surgery with no apparent complications.  I have discussed the post-operative course to date, and the expected progress moving forward.  The patient understands what complications to be concerned about.    Activity plan: No heavy lifting.  Follow up 4 weeks for 6 week postpartum check up Prentice Docker, MD 09/28/2016 10:00 AM

## 2016-10-28 ENCOUNTER — Ambulatory Visit (INDEPENDENT_AMBULATORY_CARE_PROVIDER_SITE_OTHER): Payer: BC Managed Care – PPO | Admitting: Obstetrics and Gynecology

## 2016-10-28 NOTE — Progress Notes (Signed)
Postpartum Visit   Chief Complaint  Patient presents with  . 6 week post partum   History of Present Illness: Patient is a 31 y.o. [id number] presents for postpartum visit.  Date of delivery: 09/15/2016 Type of delivery: C-Section Episiotomy No.  Laceration:no Pregnancy or labor problems:  GDM Any problems since the delivery:  no  Newborn Details:  SINGLETON :  1. Baby's name:  Mckenzie. Birth weight: 7.14oz Maternal Details:  Breast Feeding:  no Post partum depression/anxiety noted:  no Edinburgh Post-Partum Depression Score:  2  Date of last PAP: 01/2016-NORMAL  Past Medical History:  Diagnosis Date  . ASCUS with positive high risk HPV cervical 10/05/2008  . Cervical high risk HPV (human papillomavirus) test positive 05/08/2011  . Family history of breast cancer   . Gestational diabetes   . HPV (human papilloma virus) anogenital infection   . Mitral valve prolapse   . Papanicolaou smear of anus with low grade squamous intraepithelial lesion (LGSIL) 05/06/2011  . Type A blood, Rh negative     Past Surgical History:  Procedure Laterality Date  . CESAREAN SECTION N/A 09/15/2016   Procedure: CESAREAN SECTION;  Surgeon: Will Bonnet, MD;  Location: [facility] ORS;  Service: Obstetrics;  Laterality: N/A;  . COLPOSCOPY  03/2008  . CYST EXCISION     Tonsil  . WISDOM TOOTH EXTRACTION      Prior to Admission medications   Medication Sig Start Date End Date Taking? Authorizing Provider  prenatal vitamin w/FE, FA (PRENATAL 1 + 1) 27-1 MG TABS tablet Take 1 tablet by mouth daily with supper.     [provider]    No Known Allergies   Social History   Social History  . Marital status: Married    Spouse name: N/A  . Number of children: N/A  . Years of education: N/A   Occupational History  . Not on file.   Social History Main Topics  . Smoking status: Never Smoker  . Smokeless tobacco: Never Used  . Alcohol use No  . Drug use: No  . Sexual activity: Yes      Birth control/ protection: Condom   Other Topics Concern  . Not on file   Social History Narrative  . No narrative on file    Family History  Problem Relation Age of Onset  . Stroke Maternal Grandmother   . Prostate cancer Maternal Grandfather   . Cancer Paternal Grandfather   . Breast cancer Paternal Aunt 33       55-Lobular Ca Rt breast, Her 2neg/ER neg; invasive ductal cat in 2013 at age 18 in left breast er/HER 2 neu pos. stage 2 both times; s/p TAHBSO for non-cancer reason  . Prostate cancer Paternal Uncle 53       inoperable metastatic to hip    Review of Systems  Constitutional: Negative.   HENT: Negative.   Eyes: Negative.   Respiratory: Negative.   Cardiovascular: Negative.   Gastrointestinal: Negative.   Genitourinary: Negative.   Musculoskeletal: Negative.   Skin: Negative.   Neurological: Negative.   Psychiatric/Behavioral: Negative.      Physical Exam BP 114/70   Ht 5\' 1"  (1.549 m)   Wt 123 lb (55.8 kg)   BMI 23.24 kg/m   Physical Exam  Constitutional: She is oriented to person, place, and time. She appears well-developed and well-nourished. No distress.  Genitourinary: Vagina normal and uterus normal. Pelvic exam was performed with patient supine. There is no rash, tenderness or lesion on  the right labia. There is no rash, tenderness or lesion on the left labia. Vagina exhibits no lesion. No bleeding in the vagina. No signs of injury around the vagina. Right adnexum does not display mass, does not display tenderness and does not display fullness. Left adnexum does not display mass, does not display tenderness and does not display fullness. Cervix does not exhibit motion tenderness, lesion or polyp.   Uterus is anteverted. Uterus is not enlarged, tender, exhibiting a mass or irregular (is regular).  Eyes: EOM are normal. No scleral icterus.  Neck: Normal range of motion. Neck supple.  Cardiovascular: Normal rate and regular rhythm.   Pulmonary/Chest:  Effort normal and breath sounds normal. No respiratory distress. She has no wheezes. She has no rales.  Abdominal: Soft. Bowel sounds are normal. She exhibits no distension and no mass. There is no tenderness. There is no rebound and no guarding.  Musculoskeletal: Normal range of motion. She exhibits no edema.  Neurological: She is alert and oriented to person, place, and time. No cranial nerve deficit.  Skin: Skin is warm and dry. No erythema.  Psychiatric: She has a normal mood and affect. Her behavior is normal. Judgment normal.     Female Chaperone present during breast and/or pelvic exam.  Assessment: 31 y.o. [id number] presenting for 6 week postpartum visit  Plan: Problem List Items Addressed This Visit    None    Visit Diagnoses    Postpartum care and examination    -  Primary     1) Contraception Education given regarding options for contraception, including IUD placement.  I spent a great deal of time discussing options regarding contraception and provided information. She specifically is interested in the IUD.  She will consider and let me know, if she decides to utilize this form of contraception.   2)  Pap - ASCCP guidelines and rational discussed.  Patient opts for routine screening interval  3) Patient underwent screening for postpartum depression with no concerns noted.  4) Follow up 1 year for routine annual exam  Prentice Docker, MD 10/29/2016 5:57 PM

## 2017-01-11 ENCOUNTER — Encounter: Payer: Self-pay | Admitting: Obstetrics and Gynecology

## 2017-01-13 ENCOUNTER — Other Ambulatory Visit: Payer: Self-pay | Admitting: Obstetrics and Gynecology

## 2017-01-13 DIAGNOSIS — O24439 Gestational diabetes mellitus in the puerperium, unspecified control: Secondary | ICD-10-CM

## 2017-02-02 ENCOUNTER — Other Ambulatory Visit: Payer: BC Managed Care – PPO

## 2017-02-02 ENCOUNTER — Other Ambulatory Visit: Payer: Self-pay | Admitting: Obstetrics and Gynecology

## 2017-02-02 DIAGNOSIS — O24439 Gestational diabetes mellitus in the puerperium, unspecified control: Secondary | ICD-10-CM

## 2017-02-02 DIAGNOSIS — D509 Iron deficiency anemia, unspecified: Secondary | ICD-10-CM

## 2017-02-03 LAB — CBC WITH DIFFERENTIAL/PLATELET
BASOS: 0 %
Basophils Absolute: 0 10*3/uL (ref 0.0–0.2)
EOS (ABSOLUTE): 0 10*3/uL (ref 0.0–0.4)
EOS: 1 %
HEMATOCRIT: 45.3 % (ref 34.0–46.6)
HEMOGLOBIN: 14.7 g/dL (ref 11.1–15.9)
IMMATURE GRANS (ABS): 0 10*3/uL (ref 0.0–0.1)
IMMATURE GRANULOCYTES: 0 %
LYMPHS: 37 %
Lymphocytes Absolute: 1.6 10*3/uL (ref 0.7–3.1)
MCH: 28.9 pg (ref 26.6–33.0)
MCHC: 32.5 g/dL (ref 31.5–35.7)
MCV: 89 fL (ref 79–97)
MONOCYTES: 5 %
Monocytes Absolute: 0.2 10*3/uL (ref 0.1–0.9)
NEUTROS PCT: 57 %
Neutrophils Absolute: 2.5 10*3/uL (ref 1.4–7.0)
Platelets: 140 10*3/uL — ABNORMAL LOW (ref 150–379)
RBC: 5.09 x10E6/uL (ref 3.77–5.28)
RDW: 14.2 % (ref 12.3–15.4)
WBC: 4.3 10*3/uL (ref 3.4–10.8)

## 2017-02-03 LAB — IRON,TIBC AND FERRITIN PANEL
FERRITIN: 18 ng/mL (ref 15–150)
IRON SATURATION: 45 % (ref 15–55)
IRON: 149 ug/dL (ref 27–159)
Total Iron Binding Capacity: 328 ug/dL (ref 250–450)
UIBC: 179 ug/dL (ref 131–425)

## 2017-02-03 LAB — GLUCOSE TOLERANCE, 2 HOURS W/ 1HR
Glucose, 1 hour: 115 mg/dL (ref 65–179)
Glucose, 2 hour: 158 mg/dL — ABNORMAL HIGH (ref 65–152)
Glucose, Fasting: 88 mg/dL (ref 65–91)

## 2017-02-05 ENCOUNTER — Encounter: Payer: Self-pay | Admitting: Obstetrics and Gynecology

## 2017-02-05 ENCOUNTER — Telehealth: Payer: Self-pay | Admitting: Obstetrics and Gynecology

## 2017-02-05 ENCOUNTER — Other Ambulatory Visit: Payer: Self-pay | Admitting: Obstetrics and Gynecology

## 2017-02-05 DIAGNOSIS — R7302 Impaired glucose tolerance (oral): Secondary | ICD-10-CM

## 2017-02-05 NOTE — Telephone Encounter (Signed)
Discussed findings: Low platelets:  140. Recheck in 6 months vs see hematologist  Impaired glucose tolerance: recommend exercise using skeletal muscle.  Repeat labs in 3-6 months  Added hemoglobin A1c.

## 2017-02-06 ENCOUNTER — Encounter: Payer: Self-pay | Admitting: Obstetrics and Gynecology

## 2017-06-21 ENCOUNTER — Encounter: Payer: Self-pay | Admitting: Obstetrics and Gynecology

## 2017-06-23 ENCOUNTER — Other Ambulatory Visit: Payer: Self-pay | Admitting: Obstetrics and Gynecology

## 2017-06-23 DIAGNOSIS — E8881 Metabolic syndrome: Secondary | ICD-10-CM

## 2017-07-12 ENCOUNTER — Other Ambulatory Visit: Payer: BC Managed Care – PPO

## 2017-07-18 ENCOUNTER — Encounter: Payer: Self-pay | Admitting: Obstetrics and Gynecology

## 2017-07-19 ENCOUNTER — Other Ambulatory Visit: Payer: BC Managed Care – PPO

## 2017-07-19 DIAGNOSIS — E8881 Metabolic syndrome: Secondary | ICD-10-CM

## 2017-07-20 LAB — HGB A1C W/O EAG: Hgb A1c MFr Bld: 5.1 % (ref 4.8–5.6)

## 2018-10-18 NOTE — Telephone Encounter (Signed)
Pt informed we will request them when she establishes as her old doctor is in [place]

## 2018-10-18 NOTE — Telephone Encounter (Signed)
Name of Caller: Peggy Lam phone number: [phone]    Relationship to Patient: spouse/SO    Provider: Noe Gens     Practice:  [facility]     Chief Complaint/Reason for Call: Patient needs a medical records release form sent to [facility] in order for them to release the pts medical records to the office. Lyman Bishop does not have the fax number but has the phone number as [phone]. Please advise.     Best time of day caller can be reached: Any       Patient advised that office/PCP has 24-48 business hours to return their call: Yes

## 2018-11-24 ENCOUNTER — Encounter: Attending: Family Medicine | Primary: Family Medicine

## 2018-11-24 NOTE — Telephone Encounter (Signed)
Name of Caller: Marland Mcalpine phone number: [phone]    Relationship to Patient: spouse/SO    Provider: Dr. Noe Gens    Practice:  [facility]    Chief Complaint/Reason for Call: Peggy Lam states the patient has an appointment with Dr. Noe Gens scheduled for 11/29/18 at 1:45p.  Peggy Lam states he would like to verify whether the patient's medical records were received.  The office fax number was provided to [facility] so he can verify where the patient's medical records were faxed to.  Peggy Lam states he will reach out to the patient's previous physician so they can fax her medical records to Dr. Noe Gens.  Peggy Lam states he would like a callback to verify the patient's medical records were received when they come in.  Please contact [place] and advise.    Best time of day caller can be reached: Any       Patient advised that office/PCP has 24-48 business hours to return their call: No

## 2018-11-29 ENCOUNTER — Inpatient Hospital Stay: Attending: Family Medicine | Primary: Family Medicine

## 2018-11-29 ENCOUNTER — Ambulatory Visit: Admit: 2018-11-29 | Discharge: 2018-11-29 | Attending: Family Medicine | Primary: Family Medicine

## 2018-11-29 DIAGNOSIS — R7989 Other specified abnormal findings of blood chemistry: Secondary | ICD-10-CM

## 2018-11-29 DIAGNOSIS — Z0000 Encounter for general adult medical examination without abnormal findings: Secondary | ICD-10-CM

## 2018-11-29 LAB — CBC
Hematocrit: 43.9 % (ref 35.0–47.0)
Hemoglobin: 14.8 g/dL (ref 11.7–16.0)
MCH: 30 pg (ref 26.0–34.0)
MCHC: 33.8 % (ref 32.0–36.0)
MCV: 88.8 fL (ref 79.0–98.0)
MPV: 11.2 fL — ABNORMAL HIGH (ref 7.4–10.4)
Platelets: 145 10*3/uL (ref 140–440)
RBC: 4.95 10*6/uL (ref 3.80–5.20)
RDW: 12.3 % (ref 11.5–14.5)
WBC: 6.1 10*3/uL (ref 3.6–10.7)

## 2018-11-29 LAB — COMPREHENSIVE METABOLIC PANEL
ALT: 14 U/L (ref 0–34)
AST: 22 U/L (ref 15–46)
Albumin,Serum: 4.8 g/dL (ref 3.5–5.0)
Alkaline Phosphatase: 42 U/L (ref 38–126)
Anion Gap: 7 NA
BUN: 14 mg/dL (ref 7–20)
CO2: 25 mmol/L (ref 22–30)
Calcium: 9.5 mg/dL (ref 8.4–10.4)
Chloride: 105 mmol/L (ref 98–107)
Creatinine: 0.61 mg/dL (ref 0.52–1.25)
EGFR IF NonAfrican American: >90 mL/min (ref >60)
Glucose: 89 mg/dL (ref 70–100)
Potassium: 4.1 mmol/L (ref 3.5–5.1)
Sodium: 137 mmol/L (ref 135–145)
Total Bilirubin: 1.5 mg/dL — ABNORMAL HIGH (ref 0.2–1.3)
Total Protein: 8.4 g/dL — ABNORMAL HIGH (ref 6.3–8.2)
eGFR African American: >90 mL/min (ref >60)

## 2018-11-29 LAB — HEMOGLOBIN A1C
Estimated Avg Glucose: 97 mg/dL
Hemoglobin A1C: 5 % (ref 4.0–6.0)

## 2018-11-29 LAB — TSH: TSH: 0.853 u[IU]/mL (ref 0.465–4.680)

## 2018-11-29 NOTE — Telephone Encounter (Signed)
Labs are normal except for slightly elevated bilirubin and protein. Recommend recheck in 2 weeks.

## 2018-11-29 NOTE — Telephone Encounter (Signed)
S:      [facility] with request for results.    B:      Patient had testing done recently.    A:      Notes read to the patient in its entirety.  Patient verbalizes understanding.     R:      Patient encouraged to call back with any further questions.    Please advise patient regarding ordering labs, need for fasting, etc.      Reason for Disposition  ??? [1] Caller requesting NON-URGENT health information AND [2] PCP's office is the best resource    Protocols used: INFORMATION ONLY CALL - NO TRIAGE-ADULT-AH

## 2018-11-29 NOTE — Progress Notes (Signed)
Subjective:     Patient: Peggy Lam is a 33 y.o. female     HPI     She had low WBC count in the past   Had a baby two years ago   Had gestational diabetes --> diet controlled   Has two kids.   She is having a lot of hair loss. Has noticed drastic change in her hair thickness     No recent dietary changes     Had a pap a year ago - have been normal     Review of Systems   Constitutional: Negative for activity change, appetite change, chills, diaphoresis, fatigue, fever and unexpected weight change.   HENT: Negative for congestion, ear pain, postnasal drip, sinus pressure, sore throat and trouble swallowing.    Eyes: Negative for pain, redness and visual disturbance.   Respiratory: Negative for cough, shortness of breath and wheezing.    Cardiovascular: Negative for chest pain, palpitations and leg swelling.   Gastrointestinal: Negative for abdominal pain, blood in stool, constipation, diarrhea, nausea and vomiting.   Endocrine: Negative for cold intolerance, heat intolerance, polydipsia, polyphagia and polyuria.   Genitourinary: Negative for difficulty urinating, flank pain, frequency, hematuria and urgency.   Musculoskeletal: Negative for arthralgias, back pain, gait problem, joint swelling and myalgias.   Skin: Negative for color change and rash.   Neurological: Negative for dizziness, seizures, weakness and headaches.   Hematological: Negative for adenopathy. Does not bruise/bleed easily.   Psychiatric/Behavioral: Negative for agitation, confusion, decreased concentration, sleep disturbance and suicidal ideas. The patient is not nervous/anxious.         No Known Allergies     Current Outpatient Medications on File Prior to Visit   Medication Sig Dispense Refill   . Multiple Vitamins-Minerals (THERAPEUTIC MULTIVITAMIN-MINERALS) tablet Take 1 tablet by mouth daily     . Biotin 10 MG CAPS Take by mouth       No current facility-administered medications on file prior to visit.       Past Medical History:    Diagnosis Date   . Gestational diabetes       Past Surgical History:   Procedure Laterality Date   . CESAREAN SECTION       Family History   Problem Relation Age of Onset   . No Known Problems Mother    . Other Father         back pain   . Birth Defects Paternal Aunt    . Cancer Paternal Uncle         bladder and bone   . Cancer Maternal Grandmother         esophageal cancer   . Cancer Maternal Grandfather         bladder   . No Known Problems Brother      Social History     Tobacco Use   . Smoking status: Never Smoker   . Smokeless tobacco: Never Used   Substance Use Topics   . Alcohol use: Yes     Frequency: Monthly or less     Drinks per session: 1 or 2     Binge frequency: Never          Objective:     BP 112/68   Pulse 92   Temp 97.8 F (36.6 C)   Ht 5' 1.5" (1.562 m)   Wt 112 lb (50.8 kg)   BMI 20.82 kg/m     Physical Exam  Vitals signs and nursing note reviewed.  Constitutional:       Appearance: Normal appearance. She is well-developed.   HENT:      Head: Normocephalic and atraumatic.      Right Ear: Hearing, tympanic membrane and external ear normal. No drainage. No middle ear effusion.      Left Ear: Hearing, tympanic membrane and external ear normal. No drainage.  No middle ear effusion.      Nose: Nose normal.      Mouth/Throat:      Mouth: Mucous membranes are moist.      Pharynx: No oropharyngeal exudate.   Eyes:      General: No scleral icterus.        Right eye: No discharge.         Left eye: No discharge.      Conjunctiva/sclera: Conjunctivae normal.      Pupils: Pupils are equal, round, and reactive to light.   Neck:      Musculoskeletal: Normal range of motion and neck supple.      Thyroid: No thyromegaly.      Vascular: No JVD.   Cardiovascular:      Rate and Rhythm: Normal rate and regular rhythm.      Heart sounds: Normal heart sounds. No murmur. No friction rub. No gallop.    Pulmonary:      Effort: Pulmonary effort is normal. No respiratory distress.      Breath sounds: Normal  breath sounds. No wheezing or rales.   Chest:      Chest wall: No tenderness.   Abdominal:      General: Bowel sounds are normal. There is no distension.      Palpations: Abdomen is soft. There is no hepatomegaly, splenomegaly or mass.      Tenderness: There is no abdominal tenderness. There is no guarding or rebound.   Musculoskeletal: Normal range of motion.         General: No tenderness.      Right lower leg: No edema.      Left lower leg: No edema.   Lymphadenopathy:      Cervical: No cervical adenopathy.   Skin:     General: Skin is warm and dry.      Findings: No rash.   Neurological:      General: No focal deficit present.      Mental Status: She is alert and oriented to person, place, and time.      Cranial Nerves: No cranial nerve deficit.      Coordination: Coordination normal.   Psychiatric:         Behavior: Behavior normal. Behavior is cooperative.         Thought Content: Thought content normal.         Judgment: Judgment normal.         Assessment      1. Well adult exam    2. History of gestational diabetes    3. Hair loss    4. Abnormal CBC    5. Need for influenza vaccination         Plan      1. Well adult exam    2. History of gestational diabetes  - Hemoglobin A1C; Future  - Comprehensive Metabolic Panel; Future    3. Hair loss  - TSH without Reflex; Future    4. Abnormal CBC  - CBC; Future    5. Need for influenza vaccination  - INFLUENZA, QUADV, 0.5ML, 6 MO AND OLDER, IM, PF,  PREFILL SYR OR SDV (FLUZONE QUADV, PF)    UTD on immunizations.  Encouraged avoidance of tobacco and alcohol, safe sexual practice. Healthy diet with plenty of fruits, vegetables, whole grains, and lean proteins. Exercise 3-5 times per week for 30-45 minutes. Wear seatbelts. Wear sunscreen. Reviewed age appropriate screening tests.     Vern Claude, DO  11/29/18  4:23 PM      Health Maintenance Due   Topic Date Due   . Varicella vaccine (1 of 2 - 2-dose childhood series) 02/09/1986   . HIV screen  02/10/2000   .  DTaP/Tdap/Td vaccine (1 - Tdap) 02/10/2004   . Cervical cancer screen  02/09/2006

## 2018-11-29 NOTE — Telephone Encounter (Signed)
lmom to call the office.

## 2018-11-29 NOTE — Telephone Encounter (Signed)
Message released to patient as written.     Patient's further questions if applicable:     Were all questions from office addressed or relayed to the patient from encounter ? Yes

## 2018-11-29 NOTE — Other (Unsigned)
Patient Acct Nbr: [id number]   Primary AUTH/CERT:   Primary Insurance Company Name: [employer] Plan name: [facility] Miscellaneous  Primary Insurance Group Number:   Primary Insurance Plan Type: Health  Primary Insurance Policy Number: [id number]

## 2018-11-30 ENCOUNTER — Encounter

## 2018-11-30 NOTE — Telephone Encounter (Signed)
From: Leanord Asal  To: Vern Claude, DO  Sent: 11/30/2018 9:37 AM EST  Subject: Test Results Question    Hi Dr. Noe Gens,     I received my results yesterday about my Bilirubin and proteins being slightly elevated. I spoke with a nurse on call but they couldn't give me much information. I was just wondering if you could give me some insight on what those mean?     Also the nurse mentioned that you wanted me to be retested in two weeks. Is that something that I need to schedule the bloodwork for?    Thank you!

## 2018-11-30 NOTE — Telephone Encounter (Signed)
noted 

## 2018-12-12 NOTE — Telephone Encounter (Signed)
Faxed release to [facility]/fax went through and fax is in chart. 12/12/2018

## 2018-12-16 ENCOUNTER — Inpatient Hospital Stay: Attending: Family Medicine | Primary: Family Medicine

## 2018-12-16 ENCOUNTER — Encounter

## 2018-12-16 DIAGNOSIS — R17 Unspecified jaundice: Secondary | ICD-10-CM

## 2018-12-16 LAB — COMPREHENSIVE METABOLIC PANEL
ALT: 13 U/L (ref 0–34)
AST: 21 U/L (ref 15–46)
Albumin,Serum: 4.7 g/dL (ref 3.5–5.0)
Alkaline Phosphatase: 46 U/L (ref 38–126)
Anion Gap: 8 NA
BUN: 12 mg/dL (ref 7–20)
CO2: 28 mmol/L (ref 22–30)
Calcium: 9.5 mg/dL (ref 8.4–10.4)
Chloride: 103 mmol/L (ref 98–107)
Creatinine: 0.7 mg/dL (ref 0.52–1.25)
EGFR IF NonAfrican American: >90 mL/min (ref >60)
Glucose: 94 mg/dL (ref 70–100)
Potassium: 4.1 mmol/L (ref 3.5–5.1)
Sodium: 138 mmol/L (ref 135–145)
Total Bilirubin: 1.6 mg/dL — ABNORMAL HIGH (ref 0.2–1.3)
Total Protein: 8.1 g/dL (ref 6.3–8.2)
eGFR African American: >90 mL/min (ref >60)

## 2018-12-16 NOTE — Telephone Encounter (Signed)
From: Leanord Asal  To: Vern Claude, DO  Sent: 12/16/2018 10:44 AM EST  Subject: Test Results Question    I have a question about COMPREHENSIVE METABOLIC PANEL resulted on 12/16/18, 10:39 AM.    Hi Dr Noe Gens,    I see my bilirubin is still high. What does this mean? Thanks!

## 2018-12-16 NOTE — Other (Unsigned)
Patient Acct Nbr: [id number]   Primary AUTH/CERT:   Primary Insurance Company Name: [employer] Plan name: [facility] Miscellaneous  Primary Insurance Group Number:   Primary Insurance Plan Type: Health  Primary Insurance Policy Number: [id number]

## 2018-12-19 LAB — BILIRUBIN, DIRECT: Bilirubin, Direct: 0 mg/dL (ref 0.0–0.3)

## 2018-12-19 NOTE — Telephone Encounter (Signed)
Please see if lab can add on fractionated bilirubin

## 2018-12-19 NOTE — Telephone Encounter (Signed)
Added on

## 2018-12-27 NOTE — Telephone Encounter (Signed)
From: Leanord Asal  To: Vern Claude, DO  Sent: 12/27/2018 8:47 AM EST  Subject: Prescription Question    Hi Dr Noe Gens,    At my annual gynecologist visit I spoke with my doctor about bad cramping and migraines during ovulation and my menstrual cycles. She suggested an oral birth control without estrogen. I mentioned I had high bilirubin and she said she thought it should be fine since my liver is healthy but I wanted to check with your thoughts first.   I am attaching images of the pill box and also the part of the pamphlet she highlighted about discontinuing use if jaundice occurs. (That's the part the really made me want to check with you)    I'm wondering if you think it's ok or if I should wait til after we check my levels again in 6 months? Thank you.

## 2018-12-29 NOTE — Telephone Encounter (Signed)
From: Leanord Asal  To: Vern Claude, DO  Sent: 12/29/2018 2:18 PM EST  Subject: Non-Urgent Medical Question    Hi Dr Noe Gens,    Question for you, I have been doing some research about CBD oils/soft gels for mild anxiety, stress relief and joint pain. Was wondering your thoughts? And also if safe with my higher bilirubin? Thanks.

## 2019-01-12 ENCOUNTER — Encounter

## 2019-01-16 NOTE — Telephone Encounter (Signed)
Pt states it is a paper that needs filled out for his insurance, I told him to get a copy from his insurance and we will look at it

## 2019-01-16 NOTE — Telephone Encounter (Signed)
From: Leanord Asal  To: Vern Claude, DO  Sent: 01/12/2019 12:41 PM EST  Subject: Non-Urgent Medical Question    Hi Dr. Noe Gens, I have been getting several bloody noses over the last two days. I have had three so far. I got my nose cauterized years ago when I lived in [place] and then I also got it cauterized around 2011 in [place]. I haven't had nosebleeds since then until I came back to [place] with this ever-changing weather my nose is beginning to act like it did the first time I lived here. Do I need a referral to see an ENT ? Do you guys cauterize in the office? Thanks.

## 2019-01-16 NOTE — Telephone Encounter (Signed)
Name of Caller: Peggy Lam phone number: [phone]    Relationship to Patient: spouse/SO    Provider: Dr. Noe Gens    Practice:  [facility]    Chief Complaint/Reason for Call: Peggy Lam is requesting a CMS 1500 form completed for new patient visit on 11.17.20 so he can be reimbursed for the visit.  Peggy Lam can picked up the form when completed.   Please advise.    Best time of day caller can be reached: any    Patient advised that office/PCP has 24-48 business hours to return their call: No

## 2019-01-16 NOTE — Telephone Encounter (Signed)
Patient states insurance told him the office has the form. The form they told him to use was UB40 for this. It is whatever claim form that is usually sent to insurance. Please advise.

## 2019-01-16 NOTE — Telephone Encounter (Signed)
Candise Bowens, any idea?

## 2019-01-16 NOTE — Telephone Encounter (Signed)
I don't know what this form is.

## 2019-01-17 NOTE — Telephone Encounter (Signed)
I have spoken to [name], Dinorah's husband. I was able to print out what was needed. He will pick this up tomorrow. Envelope is at the front desk.

## 2019-01-17 NOTE — Telephone Encounter (Signed)
Noted  

## 2019-01-31 ENCOUNTER — Ambulatory Visit: Admit: 2019-01-31 | Discharge: 2019-01-31 | Attending: Otolaryngology/Facial Plastic Surgery | Primary: Family Medicine

## 2019-01-31 DIAGNOSIS — R040 Epistaxis: Secondary | ICD-10-CM

## 2019-01-31 NOTE — Progress Notes (Signed)
[facility]  [facility]  [address]  [place] [address]  Dept: [phone]  Dept Fax: [phone]  Loc: [phone]   Subjective:     Patient: Peggy Lam is a 34 y.o. female     HPI   Patient is a pleasant 34 year old female referred here today for evaluation of left epistaxis. She reports this orignially started in 2007 while living in [place]. Several attempts to stop with success using liquid cocaine, was eventually cauterized. She moved to [place] in 2010 and had second cauterization shortly after this. Lived in [place] for 9 years and maybe had two episodes throughout this time. Moved back to [place] in September and has had 7 episodes since December she assumes from dry [place] weather. Carries Afrin and cotton balls in her purse to use as needed. Typically stops with this and doesn't last more than 20 minutues. Uses humidifier in bedroom at night. Was using saline nasal spray but stopped using it because had severe nosebleed during last use. 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 Has been evaluated in the past by neurology for this as a teen. No other use of medications that thin the blood.  Patient also complaining of symptoms of mucus and throat clearing in the back of her throat.  She does admit to occasional reflux symptoms as well.     Review of Systems   Constitutional: Negative for chills, fatigue, fever and unexpected weight change.   HENT: Positive for nosebleeds.    Eyes: Negative for photophobia and visual disturbance.   Respiratory: Negative for shortness of breath, wheezing and stridor.    Cardiovascular: Negative for chest pain and leg swelling.   Gastrointestinal: Negative for diarrhea and nausea.   Endocrine: Negative for heat intolerance and polyuria.   Musculoskeletal: Negative for arthralgias and back pain.   Skin: Negative for color change and rash.   Neurological: Positive for headaches. Negative for dizziness, tremors and  numbness.   Hematological: Negative for adenopathy. Does not bruise/bleed easily.   Psychiatric/Behavioral: Negative for behavioral problems. The patient is not nervous/anxious.         No Known Allergies  Current Outpatient Medications   Medication Sig Dispense Refill   ??? Multiple Vitamins-Minerals (THERAPEUTIC MULTIVITAMIN-MINERALS) tablet Take 1 tablet by mouth daily     ??? Biotin 10 MG CAPS Take by mouth       No current facility-administered medications for this visit.       Past Medical History:   Diagnosis Date   ??? Gestational diabetes       Past Surgical History:   Procedure Laterality Date   ??? CESAREAN SECTION       Family History   Problem Relation Age of Onset   ??? No Known Problems Mother    ??? Other Father         back pain   ??? Birth Defects Paternal Aunt    ??? Cancer Paternal Uncle         bladder and bone   ??? Cancer Maternal Grandmother         esophageal cancer   ??? Cancer Maternal Grandfather         bladder   ??? No Known Problems Brother      Social History     Tobacco Use   ??? Smoking status: Never Smoker   ??? Smokeless tobacco: Never Used   Substance Use Topics   ???  Alcohol use: Yes     Frequency: Monthly or less     Drinks per session: 1 or 2     Binge frequency: Never      Objective:   Temp 97.5 ??F (36.4 ??C)    Ht 5\' 1"  (1.549 m)    Wt 113 lb (51.3 kg)    BMI 21.35 kg/m??     Physical Exam  Constitutional:       Appearance: Normal appearance. She is well-developed.   HENT:      Head: Normocephalic and atraumatic.      Jaw: No trismus.      Nose:      Right Nostril: No epistaxis.      Left Nostril: Epistaxis (left anterior isolated septal vessel noted to be culprit of epistaxis) present.      Mouth/Throat:      Pharynx: Uvula midline. No oropharyngeal exudate, posterior oropharyngeal erythema or uvula swelling.      Tonsils: No tonsillar exudate or tonsillar abscesses. 2+ on the right. 2+ on the left.   Eyes:      Conjunctiva/sclera: Conjunctivae normal.      Pupils: Pupils are equal, round, and reactive  to light.   Neck:      Musculoskeletal: Normal range of motion and neck supple.      Thyroid: No thyromegaly.      Trachea: No tracheal deviation.   Pulmonary:      Effort: Pulmonary effort is normal. No respiratory distress.      Breath sounds: No wheezing.   Chest:      Chest wall: No tenderness.   Musculoskeletal: Normal range of motion.         General: No tenderness.   Lymphadenopathy:      Cervical: No cervical adenopathy.   Skin:     General: Skin is warm and dry.      Findings: No erythema or rash.   Neurological:      Mental Status: She is alert and oriented to person, place, and time.      Cranial Nerves: No cranial nerve deficit.      Sensory: No sensory deficit.      Coordination: Coordination normal.   Psychiatric:         Mood and Affect: Mood and affect normal.         Behavior: Behavior normal.       Ear exam:  Bilateral: normal external auditory canal, no edema or otorrhea. TM clear, no effusion. Normal landmarks.     Procedure:     Left anterior septal nasal cauterization simple:     Indications:     Persistent left-sided nasal epistaxis despite conservative treatment.  Procedure as well as risks benefits alternatives were discussed with the patient verbal consent was obtained prior to proceeding.     Description of procedure:     The left side of the nasal cavity was topically anesthetized and decongested with 4% lidocaine with Afrin soaked cotton.  Several minutes were allowed to elapse for to take its effect.  Silver nitrate was then applied to the bleeding site with successful cauterization of the epistaxis.  Bactroban ointment was placed.  The patient tolerated the procedure without difficulty    Assessment      1. Epistaxis    2. Laryngopharyngeal reflux (LPR)         Plan      1. Epistaxis  Left isolated anterior septal vessel successfully identified and cauterized in the office  today. Patient tolerated well. Recommend continued use of in home humidification and saline nasal spray. Follow up  in 3 weeks to reevaluate and ensure no recurrent epistaxis. She agrees.   2. Laryngopharyngeal reflux (LPR)  Patient's exam is unremarkable her symptoms are consistent with possible laryngeal reflux.  I recommended initial conservative management with dietary restrictions handout is given.  If symptoms persist a 3 week follow-up consider laryngoscopy with possible medical therapy if necessary.    Binnie Rail, DO  01/31/19  3:40 PM

## 2019-02-06 MED ORDER — SUMATRIPTAN SUCCINATE 50 MG PO TABS
50 MG | ORAL_TABLET | Freq: Once | ORAL | 3 refills | Status: DC | PRN
Start: 2019-02-06 — End: 2019-02-28

## 2019-02-06 NOTE — Telephone Encounter (Signed)
From: Leanord Asal  To: Vern Claude, DO  Sent: 02/06/2019 11:37 AM EST  Subject: Non-Urgent Medical Question    Hi Dr. Noe Gens,     I visited the ENT on Tuesday of last week and they ended up cauterizing my nose for the third time. It got brought up that I take Excedrin for my migraines and that Excedrin along with Tylenol, Motrin & ibuprofen can thin the blood which isn't the best for nosebleeds.     The physicians assistant suggested I ask if you knew of a migraine medicine that wouldn't thin my blood.     For reference I have taken Excedrin three times this month so far for migraines.     Thank you!

## 2019-02-07 NOTE — Telephone Encounter (Signed)
OK, in that case, I'd recommend she checks with her insurance to determine if they cover any triptans. Other alternatives would be maxalt or relpax.

## 2019-02-07 NOTE — Telephone Encounter (Signed)
Please call the pharmacy

## 2019-02-07 NOTE — Telephone Encounter (Signed)
Per pharmacy her insurance doesn't cover the medication

## 2019-02-21 ENCOUNTER — Ambulatory Visit: Admit: 2019-02-21 | Discharge: 2019-02-21 | Attending: Otolaryngology/Facial Plastic Surgery | Primary: Family Medicine

## 2019-02-21 DIAGNOSIS — R040 Epistaxis: Secondary | ICD-10-CM

## 2019-02-21 NOTE — Progress Notes (Signed)
[facility]  [place] [address]  Dept: [phone]  Dept Fax: [phone]  Loc: [phone]     Assessment      1. Epistaxis         Plan      1. Epistaxis    Cauterization site is completely healed no evidence of any persistent scab or ulceration.  She can follow-up as needed with any recurrence of symptoms    Subjective:     Patient: Peggy Lam is a 34 y.o. female     HPI   Patient is here for follow-up from nasal cauterization for chronic epistaxis.  Has had no issues since cauterization and more importantly no recurrence of epistaxis.  Is very pleased with results and denies any concerns today  Review of Systems   Constitutional: Negative for chills and fatigue.   Eyes: Negative for photophobia and visual disturbance.   Respiratory: Negative for wheezing and stridor.    Cardiovascular: Negative for chest pain and leg swelling.   Gastrointestinal: Negative for diarrhea and nausea.   Endocrine: Negative for heat intolerance and polyuria.   Musculoskeletal: Negative for arthralgias and back pain.   Skin: Negative for color change and rash.   Neurological: Negative for tremors and numbness.   Hematological: Negative for adenopathy. Does not bruise/bleed easily.   Psychiatric/Behavioral: Negative for behavioral problems. The patient is not nervous/anxious.         No Known Allergies  Current Outpatient Medications   Medication Sig Dispense Refill   ??? SUMAtriptan (IMITREX) 50 MG tablet Take 1 tablet by mouth once as needed for Migraine 9 tablet 3   ??? Multiple Vitamins-Minerals (THERAPEUTIC MULTIVITAMIN-MINERALS) tablet Take 1 tablet by mouth daily     ??? Biotin 10 MG CAPS Take by mouth       No current facility-administered medications for this visit.       Past Medical History:   Diagnosis Date   ??? Gestational diabetes       Past Surgical History:   Procedure Laterality Date   ??? CESAREAN SECTION       Family History   Problem Relation Age of Onset   ??? No Known  Problems Mother    ??? Other Father         back pain   ??? Birth Defects Paternal Aunt    ??? Cancer Paternal Uncle         bladder and bone   ??? Cancer Maternal Grandmother         esophageal cancer   ??? Cancer Maternal Grandfather         bladder   ??? No Known Problems Brother      Social History     Tobacco Use   ??? Smoking status: Never Smoker   ??? Smokeless tobacco: Never Used   Substance Use Topics   ??? Alcohol use: Yes     Frequency: Monthly or less     Drinks per session: 1 or 2     Binge frequency: Never      Objective:   Temp 97.6 ??F (36.4 ??C)    Ht 5\' 1"  (1.549 m)    Wt 113 lb (51.3 kg)    BMI 21.35 kg/m??     Physical Exam  Constitutional:       General: She is not in acute distress.     Appearance: She is well-developed.   HENT:  Head: Normocephalic and atraumatic.      Jaw: No trismus.      Nose: No nasal deformity, septal deviation, mucosal edema or rhinorrhea.      Right Sinus: No maxillary sinus tenderness or frontal sinus tenderness.      Left Sinus: No maxillary sinus tenderness or frontal sinus tenderness.      Mouth/Throat:      Mouth: Mucous membranes are not pale, not dry and not cyanotic. No oral lesions.      Pharynx: Uvula midline. No oropharyngeal exudate or uvula swelling.      Tonsils: No tonsillar exudate. 2+ on the right. 2+ on the left.   Eyes:      Conjunctiva/sclera: Conjunctivae normal.      Pupils: Pupils are equal, round, and reactive to light.   Neck:      Musculoskeletal: Full passive range of motion without pain, normal range of motion and neck supple.      Thyroid: No thyromegaly.      Trachea: Trachea and phonation normal. No tracheal deviation.   Pulmonary:      Effort: Pulmonary effort is normal. No respiratory distress.      Breath sounds: No stridor.   Chest:      Chest wall: No tenderness.   Musculoskeletal: Normal range of motion.         General: No tenderness.   Lymphadenopathy:      Cervical: No cervical adenopathy.   Skin:     General: Skin is warm and dry.      Findings:  No erythema or rash.   Neurological:      Mental Status: She is alert and oriented to person, place, and time.      Cranial Nerves: No cranial nerve deficit.      Sensory: No sensory deficit.      Coordination: Coordination normal.   Psychiatric:         Behavior: Behavior normal.           Binnie Rail, DO  02/21/19  1:37 PM

## 2019-02-28 MED ORDER — SUMATRIPTAN SUCCINATE 50 MG PO TABS
50 MG | ORAL_TABLET | Freq: Once | ORAL | 3 refills | Status: DC | PRN
Start: 2019-02-28 — End: 2019-07-12

## 2019-02-28 NOTE — Telephone Encounter (Signed)
From: Leanord Asal  To: Vern Claude, DO  Sent: 02/28/2019 12:58 PM EST  Subject: Prescription Question    Hi you prescribed SUMAtriptan for my migraines and it has worked well for me. If you remember I mentioned it cost over $80 for 9 pills [facility]. I found out I can get for way cheaper from [facility].     [facility]   [address]  [place], [place] [address]  [place]    Can you please send another prescription as I only have 1 left to [facility] please?    Thank you,  Peggy Lam

## 2019-02-28 NOTE — Telephone Encounter (Signed)
11/29/2018

## 2019-05-10 NOTE — Telephone Encounter (Signed)
Dr Renaldo Reel can we bring this lady into today

## 2019-05-11 ENCOUNTER — Ambulatory Visit
Admit: 2019-05-11 | Discharge: 2019-05-11 | Attending: Student in an Organized Health Care Education/Training Program | Primary: Family Medicine

## 2019-05-11 DIAGNOSIS — R040 Epistaxis: Secondary | ICD-10-CM

## 2019-05-11 NOTE — Progress Notes (Signed)
Assessment and Recommendations:  Peggy Lam is a 34 y.o. female here for evaluation of epistaxis. Cauterized today in office.      -Use saline sprays 4x daily and humidifier for bedroom  -Follow up with Dr. Darral Dash or myself if having epistaxis again. I did briefly discuss SPA ligation given that her symptoms have been ongoing for 10 years hour given how minor her symptoms are do not think this would be necessary at this time    Electronically signed by Edward Jolly, MD on 05/10/19 at 3:30 PM EDT    Otolaryngology / Head and Neck Surgery Clinic Note    Peggy Lam is a 34 y.o. female who presents for evaluation of chief complaint of epistaxis. Patient has followed by my partner Dr. Bethena Midget who last saw her on 02/21/2019 for the same issue. She was originally seen for left-sided epistaxis which began 2007. Has been having chronic and recurrent epistaxis and underwent cauterization on 01/31/2019. Has been having recurrent epistaxis again as of late. She was sitting in her car and began having left-sided epistaxis placed Afrin-soaked cotton ball in there. Normally her cauterizations last for 1-2 years and thus she wanted be evaluated more urgently. No bleeding since that episode. Denies any digital trauma or nasal trauma. No anticoagulation. No family history of bleeding disorders.     PMH:  Past Medical History:   Diagnosis Date   ??? Gestational diabetes         Allergies:  No Known Allergies     Medications:    Current Outpatient Medications:   ???  SUMAtriptan (IMITREX) 50 MG tablet, Take 1 tablet by mouth once as needed for Migraine, Disp: 9 tablet, Rfl: 3  ???  Multiple Vitamins-Minerals (THERAPEUTIC MULTIVITAMIN-MINERALS) tablet, Take 1 tablet by mouth daily, Disp: , Rfl:   ???  Biotin 10 MG CAPS, Take by mouth, Disp: , Rfl:      PSH:  Past Surgical History:   Procedure Laterality Date   ??? CESAREAN SECTION          FH:  Family History   Problem Relation Age of Onset   ??? No Known Problems Mother    ??? Other Father          back pain   ??? Birth Defects Paternal Aunt    ??? Cancer Paternal Uncle         bladder and bone   ??? Cancer Maternal Grandmother         esophageal cancer   ??? Cancer Maternal Grandfather         bladder   ??? No Known Problems Brother         Physical Exam:    Constitutional:      General: Patient is not in acute distress.     Appearance: Patient is well-developed.   Eyes:      Conjunctiva/sclera: Conjunctivae normal.      Pupils: Pupils are equal, round, and reactive to light.   HENT:      Jaw: No trismus.      Nose:    No nasal deformity, mucosal edema or rhinorrhea. Anterior rhinoscopy no obvious large blood vessels     Mouth: Mucous membranes are not pale, not dry and not cyanotic. No oral lesions.      Ears: Bilateral normal external ears. Right EAC clear, TM normal. Let EAC clear, TM normal     Pharynx: Uvula midline. No oropharyngeal exudate or uvula swelling.      Tonsils:  No tonsillar exudate. No abnormal masses or lesions     Neck: No lympadenopathy  Thyroid: No significant thyromegaly.      Trachea: Trachea and phonation normal.       No tracheal deviation.   Pulmonary:      Effort: Pulmonary effort is normal. No respiratory distress.      Breath sounds: No stridor.    Musculoskeletal:       Head: Normocephalic and atraumatic.       Neck: Full passive range of motion without pain, neck supple.   Skin:     General: Skin is warm and dry.      Findings: No erythema or rash.   Neurological:      Cranial Nerves: No cranial nerve deficit.      Sensory: No sensory deficit.      Coordination: Coordination normal.   Extremities: No significant peripheral edema or varicosities  Psychiatric:          Mood and Affect: Mood and affect normal.              Cognition and Memory: Cognition and memory normal.        Procedure Note: Nasal Endoscopy  Due to difficult visualization, nasal endoscopy was required. Verbal informed consent was obtained from the patient/patient's guardian. 4% lidocaine mixed with phenylephrine  was prepared and dripped into the nose. It was placed in the bilateral naris. Following an appropriate amount of time to allow for adequate anesthesia, a flexible fiberoptic endoscope was placed into the patient's bilateral naris. The nasal cavity, middle meatus, turbinates, septum, and outflow tracts were visualized and were normal except as listed below. Significant findings included:  -LEFT mid/posterior septum with area of prominent vasculature likely source of bleeding which was cauterized under direct visualization using silver nitrate  -No masses or lesions no other obvious bleeding sources

## 2019-05-26 NOTE — Telephone Encounter (Signed)
I called and left a message for pt to call back

## 2019-06-22 NOTE — Telephone Encounter (Signed)
From: Peggy Lam  To: Vern Claude, DO  Sent: 06/22/2019 1:42 PM EDT  Subject: Non-Urgent Medical Question    Hi Dr. Noe Gens,     I am 6 weeks into my fitness journey and although I am noticing progress I am struggling with bloating after every meal. I was wondering if you had any tips or even a daily vitamin to add that may help with the bloat? I should add I was taking a women's multi (rainbow light) for a while. I recently stopped and noticed my migraines significantly subsided. Just thought I'd share that info as well. As always, thank you!

## 2019-07-12 MED ORDER — SUMATRIPTAN SUCCINATE 50 MG PO TABS
50 MG | ORAL_TABLET | Freq: Once | ORAL | 3 refills | Status: DC | PRN
Start: 2019-07-12 — End: 2019-07-12

## 2019-07-12 MED ORDER — SUMATRIPTAN SUCCINATE 50 MG PO TABS
50 MG | ORAL_TABLET | Freq: Once | ORAL | 3 refills | Status: DC | PRN
Start: 2019-07-12 — End: 2019-11-16

## 2019-07-12 NOTE — Telephone Encounter (Signed)
From: Leanord Asal  To: Vern Claude, DO  Sent: 07/11/2019 8:42 PM EDT  Subject: Prescription Question    Hi Dr. Noe Gens, I am messaging because I am out of refills for my sumatriptan. And I was wondering if you could renew that prescription for me for my migraines. Thank you so much!

## 2019-07-12 NOTE — Telephone Encounter (Signed)
See refill request.

## 2019-07-12 NOTE — Telephone Encounter (Signed)
11/29/2018

## 2019-07-12 NOTE — Telephone Encounter (Signed)
11/29/2018

## 2019-07-28 NOTE — Telephone Encounter (Signed)
Left a message for pt to call back an make an apt

## 2019-09-27 ENCOUNTER — Encounter

## 2019-09-27 NOTE — Telephone Encounter (Signed)
From: Leanord Asal  To: Vern Claude, DO  Sent: 09/27/2019 3:02 PM EDT  Subject: Non-Urgent Medical Question    Hi Dr. Noe Gens, a few months back I had routine blood work and my bilirubin came back elevated. You said to come back in 6 months to get it evaluated again. Life got busy and I am a few months late but wanted to see about getting that checked again. I have noticed the outer whites of my eye have a yellow tint. Thanks.

## 2019-09-29 ENCOUNTER — Encounter

## 2019-09-29 ENCOUNTER — Inpatient Hospital Stay: Attending: Family Medicine | Primary: Family Medicine

## 2019-09-29 LAB — HEPATIC FUNCTION PANEL
ALT: 12 U/L (ref 0–34)
AST: 24 U/L (ref 15–46)
Albumin,Serum: 4.4 g/dL (ref 3.5–5.0)
Alkaline Phosphatase: 53 U/L (ref 38–126)
Bilirubin, Direct: 0 mg/dL (ref 0.0–0.3)
Total Bilirubin: 2.3 mg/dL — ABNORMAL HIGH (ref 0.2–1.3)
Total Protein: 7.9 g/dL (ref 6.3–8.2)

## 2019-09-29 NOTE — Telephone Encounter (Signed)
From: Leanord Asal  To: Vern Claude, DO  Sent: 09/29/2019 10:37 AM EDT  Subject: Test Results Question    I see the bilirubin has elevated more than last time. What does this mean? Where do we go from here?

## 2019-10-10 ENCOUNTER — Inpatient Hospital Stay: Admit: 2019-10-10 | Attending: Family Medicine | Primary: Family Medicine

## 2019-10-10 NOTE — Telephone Encounter (Signed)
From: Leanord Asal  To: Vern Claude, DO  Sent: 10/10/2019 9:53 AM EDT  Subject: Test Results Question    What does all this mean?

## 2019-11-17 MED ORDER — SUMATRIPTAN SUCCINATE 50 MG PO TABS
50 MG | ORAL_TABLET | Freq: Once | ORAL | 0 refills | Status: AC | PRN
Start: 2019-11-17 — End: 2019-11-17

## 2019-11-17 NOTE — Telephone Encounter (Signed)
11/29/2018

## 2019-11-17 NOTE — Telephone Encounter (Signed)
One month sent, but needs an appt

## 2019-12-18 ENCOUNTER — Ambulatory Visit
Admit: 2019-12-18 | Discharge: 2019-12-18 | Payer: PRIVATE HEALTH INSURANCE | Attending: Family Medicine | Primary: Family Medicine

## 2019-12-18 DIAGNOSIS — Z0000 Encounter for general adult medical examination without abnormal findings: Secondary | ICD-10-CM

## 2019-12-18 MED ORDER — RIZATRIPTAN BENZOATE 10 MG PO TABS
10 MG | ORAL_TABLET | Freq: Once | ORAL | 5 refills | Status: DC | PRN
Start: 2019-12-18 — End: 2020-06-21

## 2019-12-18 NOTE — Progress Notes (Signed)
Marland Kitchen   [facility]  [address]  [place] [address]  Dept: [phone]  Dept Fax: [phone]    Visit type: Established patient    Reason for Visit: Annual Exam         Assessment and Plan       1. Well adult exam  -     Comprehensive Metabolic Panel; Future  -     CBC; Future  2. Flu vaccine need  -     INFLUENZA, QUADV, 6 MO AND OLDER, IM, PF, PREFILL SYR OR SDV, 0.5ML (FLULAVAL QUADV, PF)  3. Chronic migraine without aura without status migrainosus, not intractable  -     rizatriptan (MAXALT) 10 MG tablet; Take 1 tablet by mouth once as needed for Migraine May repeat in 2 hours if needed, Disp-9 tablet, R-5Normal  Chronic. Switch imitrex to maxalt. Otherwise stable.   4. Need for hepatitis C screening test  -     Hepatitis C Antibody; Future  5. Screening, lipid  -     Lipid Panel; Future    UTD on immunizations.  Encouraged avoidance of tobacco and alcohol, safe sexual practice. Healthy diet with plenty of fruits, vegetables, whole grains, and lean proteins. Exercise 3-5 times per week for 30-45 minutes. Wear seatbelts. Wear sunscreen. Reviewed age appropriate screening tests.     Return in about 1 year (around 12/17/2020).       Subjective       HPI   Fusaye is a 34 yo female who presents for a well adult exam   She has a hx of migraine  Imitrex helps within 20 minutes but  Will feel shaky and dizzy on imitrex     Review of Systems   Constitutional: Negative for activity change, appetite change, chills, diaphoresis, fatigue, fever and unexpected weight change.   HENT: Negative for congestion, ear pain, postnasal drip, sinus pressure, sore throat and trouble swallowing.    Eyes: Negative for pain, redness and visual disturbance.   Respiratory: Negative for cough, shortness of breath and wheezing.    Cardiovascular: Negative for chest pain, palpitations and leg swelling.   Gastrointestinal: Negative for abdominal pain, blood in stool, constipation, diarrhea, nausea  and vomiting.   Endocrine: Negative for cold intolerance, heat intolerance, polydipsia, polyphagia and polyuria.   Genitourinary: Negative for difficulty urinating, flank pain, frequency, hematuria and urgency.   Musculoskeletal: Negative for arthralgias, back pain, gait problem, joint swelling and myalgias.   Skin: Negative for color change and rash.   Neurological: Negative for dizziness, seizures, weakness and headaches.   Hematological: Negative for adenopathy. Does not bruise/bleed easily.   Psychiatric/Behavioral: Negative for agitation, confusion, decreased concentration, sleep disturbance and suicidal ideas. The patient is not nervous/anxious.         No Known Allergies    Outpatient Medications Prior to Visit   Medication Sig Dispense Refill   . Bacillus Coagulans-Inulin (PROBIOTIC-PREBIOTIC PO) Take by mouth     . SUMAtriptan (IMITREX) 50 MG tablet Take 1 tablet by mouth once as needed for Migraine 9 tablet 0   . Multiple Vitamins-Minerals (THERAPEUTIC MULTIVITAMIN-MINERALS) tablet Take 1 tablet by mouth daily     . Biotin 10 MG CAPS Take by mouth       No facility-administered medications prior to visit.        Past Medical History:   Diagnosis Date   . Gestational diabetes         Social History  Tobacco Use   . Smoking status: Never Smoker   . Smokeless tobacco: Never Used   Substance Use Topics   . Alcohol use: Yes        Past Surgical History:   Procedure Laterality Date   . CESAREAN SECTION         Family History   Problem Relation Age of Onset   . No Known Problems Mother    . Other Father         back pain   . Birth Defects Paternal Aunt    . Cancer Paternal Uncle         bladder and bone   . Cancer Maternal Grandmother         esophageal cancer   . Cancer Maternal Grandfather         bladder   . No Known Problems Brother        Objective       BP 122/70   Pulse 96   Temp 98.6 F (37 C)   Ht 5\' 1"  (1.549 m)   Wt 108 lb (49 kg)   BMI 20.41 kg/m   Physical Exam  Vitals and nursing note  reviewed.   Constitutional:       Appearance: Normal appearance. She is well-developed.   HENT:      Head: Normocephalic and atraumatic.      Right Ear: Hearing, tympanic membrane and external ear normal. No drainage. No middle ear effusion.      Left Ear: Hearing, tympanic membrane and external ear normal. No drainage.  No middle ear effusion.      Nose: Nose normal.      Mouth/Throat:      Mouth: Mucous membranes are moist.      Pharynx: No oropharyngeal exudate.   Eyes:      General: No scleral icterus.        Right eye: No discharge.         Left eye: No discharge.      Conjunctiva/sclera: Conjunctivae normal.      Pupils: Pupils are equal, round, and reactive to light.   Neck:      Thyroid: No thyromegaly.      Vascular: No JVD.   Cardiovascular:      Rate and Rhythm: Normal rate and regular rhythm.      Heart sounds: Normal heart sounds. No murmur heard.  No friction rub. No gallop.    Pulmonary:      Effort: Pulmonary effort is normal. No respiratory distress.      Breath sounds: Normal breath sounds. No wheezing or rales.   Chest:      Chest wall: No tenderness.   Abdominal:      General: Bowel sounds are normal. There is no distension.      Palpations: Abdomen is soft. There is no hepatomegaly, splenomegaly or mass.      Tenderness: There is no abdominal tenderness. There is no guarding or rebound.   Musculoskeletal:         General: No tenderness. Normal range of motion.      Cervical back: Normal range of motion and neck supple.      Right lower leg: No edema.      Left lower leg: No edema.   Lymphadenopathy:      Cervical: No cervical adenopathy.   Skin:     General: Skin is warm and dry.      Findings: No rash.   Neurological:  General: No focal deficit present.      Mental Status: She is alert and oriented to person, place, and time.      Cranial Nerves: No cranial nerve deficit.      Coordination: Coordination normal.   Psychiatric:         Behavior: Behavior normal. Behavior is cooperative.          Thought Content: Thought content normal.         Judgment: Judgment normal.           Data Reviewed and Summarized       Labs:     Imaging/Testing:        Vern Claude, DO

## 2019-12-25 ENCOUNTER — Encounter

## 2019-12-25 LAB — LIPID PANEL
Chol/HDL Ratio: 3 NA
Cholesterol: 185 mg/dL (ref <200)
HDL: 64 mg/dL — ABNORMAL HIGH (ref 40–60)
LDL Cholesterol: 102 mg/dL — AB (ref <100)
Triglycerides: 94 mg/dL (ref <150)

## 2019-12-25 LAB — COMPREHENSIVE METABOLIC PANEL
ALT: 12 U/L (ref 0–34)
AST: 22 U/L (ref 15–46)
Albumin,Serum: 4.2 g/dL (ref 3.5–5.0)
Alkaline Phosphatase: 48 U/L (ref 38–126)
Anion Gap: 4 mmol/L (ref 3–13)
BUN: 12 mg/dL (ref 9–20)
CO2: 27 mmol/L (ref 22–30)
Calcium: 9.4 mg/dL (ref 8.4–10.4)
Chloride: 108 mmol/L — ABNORMAL HIGH (ref 98–107)
Creatinine: 0.58 mg/dL (ref 0.52–1.25)
EGFR IF NonAfrican American: >90 mL/min (ref >60)
Glucose: 101 mg/dL — ABNORMAL HIGH (ref 70–100)
Potassium: 4 mmol/L (ref 3.5–5.1)
Sodium: 138 mmol/L (ref 135–145)
Total Bilirubin: 1.2 mg/dL (ref 0.2–1.3)
Total Protein: 7.5 g/dL (ref 6.3–8.2)
eGFR African American: >90 mL/min (ref >60)

## 2019-12-25 LAB — CBC
Hematocrit: 43.2 % (ref 35.0–47.0)
Hemoglobin: 14.5 g/dL (ref 11.7–16.0)
MCH: 29.7 pg (ref 26.0–34.0)
MCHC: 33.6 % (ref 32.0–36.0)
MCV: 88.4 fL (ref 79.0–98.0)
MPV: 11.1 fL — ABNORMAL HIGH (ref 7.4–10.4)
Platelets: 114 10*3/uL — ABNORMAL LOW (ref 140–440)
RBC: 4.89 10*6/uL (ref 3.80–5.20)
RDW: 12.7 % (ref 11.5–14.5)
WBC: 4.4 10*3/uL (ref 3.6–10.7)

## 2019-12-25 LAB — HEPATITIS C ANTIBODY: Hepatitis C Ab: NOT DETECTED NA

## 2019-12-25 NOTE — Other (Unsigned)
Patient Acct Nbr: [id number]   Primary AUTH/CERT:   Primary Insurance Company Name: [employer]  Primary Insurance Plan name: [facility] Rule  Primary Insurance Group Number:   Primary Insurance Plan Type: Health  Primary Insurance Policy Number: [id number]

## 2019-12-25 NOTE — Telephone Encounter (Signed)
From: Leanord Asal  To: Dr. Vern Claude  Sent: 12/25/2019 9:53 AM EST  Subject: Question regarding COMPREHENSIVE METABOLIC PANEL    Hi Dr Noe Gens,     I was able to go get my blood work done this morning and have already received the results. I saw that there was some levels that were high. Just looking to see if this is something I need to be concerned about.    Lipid panel:  HDL 6.4  LDL cholesterol 102    Chloride 108  Glucose 101       I see that my bilirubin is now within the normal range! 1.2    Again please let me know what these other numbers mean and if this is something I should be alarmed with. Thank you so much

## 2019-12-26 NOTE — Telephone Encounter (Signed)
PA done for maxalt 10 mg  via cover my meds

## 2019-12-27 NOTE — Telephone Encounter (Signed)
PA denied stating this drug isn't covered under her insurance plan. Sent pt a my chart message

## 2019-12-27 NOTE — Telephone Encounter (Signed)
Have her check to see if relpax would be an option

## 2019-12-27 NOTE — Telephone Encounter (Signed)
Sent mychart message

## 2020-06-21 ENCOUNTER — Encounter

## 2020-06-23 ENCOUNTER — Encounter

## 2020-06-24 MED ORDER — RIZATRIPTAN BENZOATE 10 MG PO TABS
10 MG | ORAL_TABLET | Freq: Once | ORAL | 2 refills | Status: DC | PRN
Start: 2020-06-24 — End: 2020-10-08

## 2020-06-24 MED ORDER — RIZATRIPTAN BENZOATE 10 MG PO TABS
10 MG | ORAL_TABLET | Freq: Once | ORAL | 2 refills | Status: AC | PRN
Start: 2020-06-24 — End: 2020-06-24

## 2020-06-24 NOTE — Telephone Encounter (Signed)
12/18/2019

## 2020-06-24 NOTE — Telephone Encounter (Signed)
12/18/2019  -

## 2020-10-08 ENCOUNTER — Encounter

## 2020-10-08 MED ORDER — RIZATRIPTAN BENZOATE 10 MG PO TABS
10 MG | ORAL_TABLET | Freq: Once | ORAL | 2 refills | Status: AC | PRN
Start: 2020-10-08 — End: 2020-10-08

## 2020-10-08 NOTE — Telephone Encounter (Signed)
12/18/2019
